# Patient Record
Sex: Female | Born: 1980
Health system: Southern US, Community
[De-identification: ages and names within clinical notes are randomized; demographics above are authoritative.]

## PROBLEM LIST (undated history)

## (undated) DIAGNOSIS — L719 Rosacea, unspecified: Secondary | ICD-10-CM

## (undated) DIAGNOSIS — M069 Rheumatoid arthritis, unspecified: Secondary | ICD-10-CM

## (undated) HISTORY — DX: Rheumatoid arthritis, unspecified: M06.9

## (undated) HISTORY — DX: Rosacea, unspecified: L71.9

---
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
## 2004-01-30 ENCOUNTER — Other Ambulatory Visit: Admission: RE | Admit: 2004-01-30 | Discharge: 2004-01-30 | Payer: Self-pay | Admitting: Family Medicine

## 2004-12-16 HISTORY — PX: OTHER SURGICAL HISTORY: SHX169

## 2005-01-23 ENCOUNTER — Other Ambulatory Visit: Admission: RE | Admit: 2005-01-23 | Discharge: 2005-01-23 | Payer: Self-pay | Admitting: Family Medicine

## 2005-07-24 ENCOUNTER — Other Ambulatory Visit: Admission: RE | Admit: 2005-07-24 | Discharge: 2005-07-24 | Payer: Self-pay | Admitting: Family Medicine

## 2006-02-11 ENCOUNTER — Other Ambulatory Visit: Admission: RE | Admit: 2006-02-11 | Discharge: 2006-02-11 | Payer: Self-pay | Admitting: Family Medicine

## 2007-12-15 ENCOUNTER — Encounter: Admission: RE | Admit: 2007-12-15 | Discharge: 2008-02-02 | Payer: Self-pay | Admitting: Family Medicine

## 2008-04-14 ENCOUNTER — Encounter: Admission: RE | Admit: 2008-04-14 | Discharge: 2008-04-14 | Payer: Self-pay | Admitting: Rheumatology

## 2008-11-25 ENCOUNTER — Ambulatory Visit (HOSPITAL_COMMUNITY): Admission: RE | Admit: 2008-11-25 | Discharge: 2008-11-25 | Payer: Self-pay | Admitting: Obstetrics and Gynecology

## 2008-11-29 ENCOUNTER — Ambulatory Visit (HOSPITAL_COMMUNITY): Admission: RE | Admit: 2008-11-29 | Discharge: 2008-11-29 | Payer: Self-pay | Admitting: Obstetrics and Gynecology

## 2009-10-31 ENCOUNTER — Ambulatory Visit (HOSPITAL_COMMUNITY): Admission: RE | Admit: 2009-10-31 | Discharge: 2009-10-31 | Payer: Self-pay | Admitting: Obstetrics and Gynecology

## 2009-11-21 ENCOUNTER — Ambulatory Visit (HOSPITAL_COMMUNITY): Admission: RE | Admit: 2009-11-21 | Discharge: 2009-11-21 | Payer: Self-pay | Admitting: Obstetrics and Gynecology

## 2010-03-28 ENCOUNTER — Inpatient Hospital Stay (HOSPITAL_COMMUNITY): Admission: AD | Admit: 2010-03-28 | Discharge: 2010-03-30 | Payer: Self-pay | Admitting: Obstetrics and Gynecology

## 2011-01-02 ENCOUNTER — Encounter
Admission: RE | Admit: 2011-01-02 | Discharge: 2011-01-02 | Payer: Self-pay | Source: Home / Self Care | Attending: General Surgery | Admitting: General Surgery

## 2011-01-06 ENCOUNTER — Encounter: Payer: Self-pay | Admitting: Obstetrics and Gynecology

## 2011-03-06 LAB — CBC
HCT: 30.1 % — ABNORMAL LOW (ref 36.0–46.0)
HCT: 35.9 % — ABNORMAL LOW (ref 36.0–46.0)
MCHC: 33.3 g/dL (ref 30.0–36.0)
MCV: 82.9 fL (ref 78.0–100.0)
Platelets: 205 10*3/uL (ref 150–400)
Platelets: 267 10*3/uL (ref 150–400)
RDW: 14.2 % (ref 11.5–15.5)
WBC: 11.9 10*3/uL — ABNORMAL HIGH (ref 4.0–10.5)

## 2011-03-06 LAB — RPR: RPR Ser Ql: NONREACTIVE

## 2016-03-20 DIAGNOSIS — M064 Inflammatory polyarthropathy: Secondary | ICD-10-CM | POA: Diagnosis not present

## 2016-03-20 DIAGNOSIS — M79641 Pain in right hand: Secondary | ICD-10-CM | POA: Diagnosis not present

## 2016-03-20 DIAGNOSIS — M2550 Pain in unspecified joint: Secondary | ICD-10-CM | POA: Diagnosis not present

## 2016-03-20 DIAGNOSIS — M25561 Pain in right knee: Secondary | ICD-10-CM | POA: Diagnosis not present

## 2016-03-20 DIAGNOSIS — M79671 Pain in right foot: Secondary | ICD-10-CM | POA: Diagnosis not present

## 2016-06-16 DIAGNOSIS — J019 Acute sinusitis, unspecified: Secondary | ICD-10-CM | POA: Diagnosis not present

## 2016-06-16 DIAGNOSIS — H6693 Otitis media, unspecified, bilateral: Secondary | ICD-10-CM | POA: Diagnosis not present

## 2016-06-20 DIAGNOSIS — J329 Chronic sinusitis, unspecified: Secondary | ICD-10-CM | POA: Diagnosis not present

## 2016-06-20 DIAGNOSIS — H6690 Otitis media, unspecified, unspecified ear: Secondary | ICD-10-CM | POA: Diagnosis not present

## 2016-06-20 DIAGNOSIS — J209 Acute bronchitis, unspecified: Secondary | ICD-10-CM | POA: Diagnosis not present

## 2016-09-19 ENCOUNTER — Ambulatory Visit (INDEPENDENT_AMBULATORY_CARE_PROVIDER_SITE_OTHER): Payer: Federal, State, Local not specified - PPO | Admitting: Rheumatology

## 2016-09-19 DIAGNOSIS — M79641 Pain in right hand: Secondary | ICD-10-CM | POA: Diagnosis not present

## 2016-09-19 DIAGNOSIS — M25562 Pain in left knee: Secondary | ICD-10-CM | POA: Diagnosis not present

## 2016-09-19 DIAGNOSIS — M25461 Effusion, right knee: Secondary | ICD-10-CM | POA: Diagnosis not present

## 2017-03-21 DIAGNOSIS — Z0000 Encounter for general adult medical examination without abnormal findings: Secondary | ICD-10-CM | POA: Diagnosis not present

## 2017-03-21 DIAGNOSIS — Z23 Encounter for immunization: Secondary | ICD-10-CM | POA: Diagnosis not present

## 2017-03-21 DIAGNOSIS — E785 Hyperlipidemia, unspecified: Secondary | ICD-10-CM | POA: Diagnosis not present

## 2017-09-08 DIAGNOSIS — Z6823 Body mass index (BMI) 23.0-23.9, adult: Secondary | ICD-10-CM | POA: Diagnosis not present

## 2017-09-08 DIAGNOSIS — Z01419 Encounter for gynecological examination (general) (routine) without abnormal findings: Secondary | ICD-10-CM | POA: Diagnosis not present

## 2017-12-25 DIAGNOSIS — R399 Unspecified symptoms and signs involving the genitourinary system: Secondary | ICD-10-CM | POA: Diagnosis not present

## 2018-01-29 DIAGNOSIS — Z30430 Encounter for insertion of intrauterine contraceptive device: Secondary | ICD-10-CM | POA: Diagnosis not present

## 2018-02-19 DIAGNOSIS — J0100 Acute maxillary sinusitis, unspecified: Secondary | ICD-10-CM | POA: Diagnosis not present

## 2018-02-25 DIAGNOSIS — N939 Abnormal uterine and vaginal bleeding, unspecified: Secondary | ICD-10-CM | POA: Diagnosis not present

## 2018-03-24 DIAGNOSIS — Z0000 Encounter for general adult medical examination without abnormal findings: Secondary | ICD-10-CM | POA: Diagnosis not present

## 2018-03-24 DIAGNOSIS — J309 Allergic rhinitis, unspecified: Secondary | ICD-10-CM | POA: Diagnosis not present

## 2018-03-24 DIAGNOSIS — M069 Rheumatoid arthritis, unspecified: Secondary | ICD-10-CM | POA: Diagnosis not present

## 2018-03-24 DIAGNOSIS — E785 Hyperlipidemia, unspecified: Secondary | ICD-10-CM | POA: Diagnosis not present

## 2018-04-01 DIAGNOSIS — F411 Generalized anxiety disorder: Secondary | ICD-10-CM | POA: Diagnosis not present

## 2018-04-06 NOTE — Progress Notes (Signed)
Office Visit Note  Patient: Leah Ball             Date of Birth: 1981/10/06           MRN: [id number]             PCP: Jarrett Soho, PA-C Referring: Marcelo Baldy, PA-C Visit Date: 04/20/2018 Occupation: @GUAROCC @    Subjective:  Hand pain    History of Present Illness: Leah Ball is a 37 y.o. female with history of inflammatory arthritis. Patient was last seen in October 2017.  She reports that she has been off of medications due to considering having a second child.  She is no longer planning pregnancy and has a Mirena IUD. She was previously on Sulfasalazine and tolerated it well. She also was intermittently put on Prednisone tapers. She reports that she continues to have left knee pain, stiffness, and occasional swelling.  She denies any recent injuries.  She reports she occasionally experiences a locking or catching sensation in her left knee.  She denies any pain in right knee.  She states that CBD oil and Advil have been helping with pain relief and inflammation.  In December she started having pain in her right hand with intermittent swelling. She also has stiffness in bilateral hands.  She denies any injuries or overuse activities.  She is right hand dominant. She has neck stiffness and muscle tension.  She denies any other joint pain or swelling.      Activities of Daily Living:  Patient reports morning stiffness for 30  minutes.   Patient Denies nocturnal pain.  Difficulty dressing/grooming: Denies Difficulty climbing stairs: Reports Difficulty getting out of chair: Reports Difficulty using hands for taps, buttons, cutlery, and/or writing: Reports   Review of Systems  Constitutional: Negative for fatigue.  HENT: Positive for mouth dryness. Negative for mouth sores and nose dryness.   Eyes: Positive for redness and dryness. Negative for pain and visual disturbance.  Respiratory: Negative for cough, hemoptysis, shortness of breath and difficulty breathing.    Cardiovascular: Negative for chest pain, palpitations, hypertension and swelling in legs/feet.  Gastrointestinal: Negative for blood in stool, constipation and diarrhea.  Endocrine: Negative for increased urination.  Genitourinary: Negative for painful urination.  Musculoskeletal: Positive for arthralgias, joint pain, joint swelling and morning stiffness. Negative for myalgias, muscle weakness, muscle tenderness and myalgias.  Skin: Positive for redness and sensitivity to sunlight. Negative for color change, pallor, rash, hair loss, nodules/bumps, skin tightness and ulcers.  Allergic/Immunologic: Negative for susceptible to infections.  Neurological: Negative for dizziness, numbness, headaches and weakness.  Hematological: Negative for swollen glands.  Psychiatric/Behavioral: Negative for depressed mood and sleep disturbance. The patient is nervous/anxious.     PMFS History:  There are no active problems to display for this patient.   History reviewed. No pertinent past medical history.  Family History  Problem Relation Age of Onset  . Heart attack Father   . High Cholesterol Father   . High Cholesterol Sister   . Healthy Son    Past Surgical History:  Procedure Laterality Date  . dental implant  2006  . WISDOM TOOTH EXTRACTION  1999   Social History   Social History Narrative  . Not on file     Objective: Vital Signs: BP 93/68 (BP Location: Left Arm, Patient Position: Sitting, Cuff Size: Normal)   Pulse 90   Resp 14   Ht 5\' 6"  (1.676 m)   Wt 134 lb (60.8 kg)   BMI  21.63 kg/m    Physical Exam  Constitutional: She is oriented to person, place, and time. She appears well-developed and well-nourished.  HENT:  Head: Normocephalic and atraumatic.  Eyes: Conjunctivae and EOM are normal.  Neck: Normal range of motion.  Cardiovascular: Normal rate, regular rhythm, normal heart sounds and intact distal pulses.  Pulmonary/Chest: Effort normal and breath sounds normal.    Abdominal: Soft. Bowel sounds are normal.  Lymphadenopathy:    She has no cervical adenopathy.  Neurological: She is alert and oriented to person, place, and time.  Skin: Skin is warm and dry. Capillary refill takes less than 2 seconds.  Psychiatric: She has a normal mood and affect. Her behavior is normal.  Nursing note and vitals reviewed.    Musculoskeletal Exam: C-spine, thoracic spine, and lumbar spine good ROM.  No midline spinal tenderness or SI joint tenderness.  Shoulder joints, elbow joints, wrist joints, MCPs, PIPs, and DIPs good ROM.  She has a mild flexion contracture in right 2nd PIP.  She has tenderness of right 2nd and 3rd PIP joints.  No synovitis was noted.  Hip joints, knee joints, ankle joints, MTPs, PIPs, and DIPs good ROM with no synovitis.  She has crepitus of left knee.  No warmth or effusion noted.  No tenderness of trochanteric bursa bilaterally.    CDAI Exam: No CDAI exam completed.    Investigation: No additional findings. CBC Latest Ref Rng & Units 03/29/2010 03/28/2010  WBC 4.0 - 10.5 K/uL 11.6(H) 11.9(H)  Hemoglobin 12.0 - 15.0 g/dL 10.1(L) 11.7(L)  Hematocrit 36.0 - 46.0 % 30.1(L) 35.9(L)  Platelets 150 - 400 K/uL 205 DELTA CHECK NOTED 267    Imaging: No results found.  Speciality Comments: No specialty comments available.    Procedures:  No procedures performed Allergies: Patient has no known allergies.   Assessment / Plan:     Visit Diagnoses: Inflammatory arthritis -She has no active synovitis noted.  She has tenderness of right 2nd and 3rd PIP joints.  She has been having increased pain and stiffness in her right hand since December 2018. She continues to have chronic pain in her left knee.  No warmth or effusion on exam today. She was previously on Sulfasalazine but discontinued due to planning pregnancy.  She currently has the Mirena IUD and is not planning pregnancy. She would like to restart on Sulfasalazine due to tolerating the medication  in the past.  X-rays of bilateral hands were obtained today.  We will schedule an ultrasound of bilateral hands to if there is active synovitis.  Consent for sulfasalazine was obtained today in case she does decide to start on it in the future.  CBC, CMP, and TB gold were also ordered today.   Medication counseling:  G6PD: WNL 02/25/2016.  Patient was counseled on the purpose, proper use, and adverse effects of sulfasalazine including risk of infection and chance of nausea, headache, and sun sensitivity.  Also discussed risk of skin rash and advised patient to stop the medication and let us know if she develops a rash. Also discussed for the potential of discoloration of the urine, sweat, or tears.  Reviewed the importance of frequent labs to monitor liver, kidneys, and blood counts; and provided patient with standing lab instructions.  Provided patient with educational materials on sulfasalazine and answered all questions.    Pain in both hands - X-rays of bilateral hands were obtained today. Plan: XR Hand 2 View Left, XR Hand 2 View Right  H/O immunosuppressive therapy -  CBC, CMP, and TB gold were ordered today. Plan: CBC with Differential/Platelet, COMPLETE METABOLIC PANEL WITH GFR, QuantiFERON-TB Gold Plus    Orders: Orders Placed This Encounter  Procedures  . XR Hand 2 View Left  . XR Hand 2 View Right  . CBC with Differential/Platelet  . COMPLETE METABOLIC PANEL WITH GFR  . QuantiFERON-TB Gold Plus   No orders of the defined types were placed in this encounter.   Face-to-face time spent with patient was 30 minutes. >50% of time was spent in counseling and coordination of care.  Follow-Up Instructions: Return in about 3 months (around 07/21/2018) for Inflammatory arthritis .   Gearldine Bienenstockaylor M Eddie Koc, PA-C  Note - This record has been created using Dragon software.  Chart creation errors have been sought, but may not always  have been located. Such creation errors do not reflect on  the  standard of medical care.

## 2018-04-07 DIAGNOSIS — F411 Generalized anxiety disorder: Secondary | ICD-10-CM | POA: Diagnosis not present

## 2018-04-14 DIAGNOSIS — F411 Generalized anxiety disorder: Secondary | ICD-10-CM | POA: Diagnosis not present

## 2018-04-20 ENCOUNTER — Ambulatory Visit (INDEPENDENT_AMBULATORY_CARE_PROVIDER_SITE_OTHER): Payer: Federal, State, Local not specified - PPO

## 2018-04-20 ENCOUNTER — Ambulatory Visit (INDEPENDENT_AMBULATORY_CARE_PROVIDER_SITE_OTHER): Payer: Self-pay

## 2018-04-20 ENCOUNTER — Encounter: Payer: Self-pay | Admitting: Physician Assistant

## 2018-04-20 ENCOUNTER — Ambulatory Visit: Payer: Federal, State, Local not specified - PPO | Admitting: Physician Assistant

## 2018-04-20 VITALS — BP 93/68 | HR 90 | Resp 14 | Ht 66.0 in | Wt 134.0 lb

## 2018-04-20 DIAGNOSIS — M79641 Pain in right hand: Secondary | ICD-10-CM

## 2018-04-20 DIAGNOSIS — M79642 Pain in left hand: Secondary | ICD-10-CM

## 2018-04-20 DIAGNOSIS — Z9225 Personal history of immunosupression therapy: Secondary | ICD-10-CM

## 2018-04-20 DIAGNOSIS — M1990 Unspecified osteoarthritis, unspecified site: Secondary | ICD-10-CM | POA: Diagnosis not present

## 2018-04-20 NOTE — Patient Instructions (Addendum)
Knee Exercises Ask your health care provider which exercises are safe for you. Do exercises exactly as told by your health care provider and adjust them as directed. It is normal to feel mild stretching, pulling, tightness, or discomfort as you do these exercises, but you should stop right away if you feel sudden pain or your pain gets worse.Do not begin these exercises until told by your health care provider. STRETCHING AND RANGE OF MOTION EXERCISES These exercises warm up your muscles and joints and improve the movement and flexibility of your knee. These exercises also help to relieve pain, numbness, and tingling. Exercise A: Knee Extension, Prone 1. Lie on your abdomen on a bed. 2. Place your left / right knee just beyond the edge of the surface so your knee is not on the bed. You can put a towel under your left / right thigh just above your knee for comfort. 3. Relax your leg muscles and allow gravity to straighten your knee. You should feel a stretch behind your left / right knee. 4. Hold this position for __________ seconds. 5. Scoot up so your knee is supported between repetitions. Repeat __________ times. Complete this stretch __________ times a day. Exercise B: Knee Flexion, Active  1. Lie on your back with both knees straight. If this causes back discomfort, bend your left / right knee so your foot is flat on the floor. 2. Slowly slide your left / right heel back toward your buttocks until you feel a gentle stretch in the front of your knee or thigh. 3. Hold this position for __________ seconds. 4. Slowly slide your left / right heel back to the starting position. Repeat __________ times. Complete this exercise __________ times a day. Exercise C: Quadriceps, Prone  1. Lie on your abdomen on a firm surface, such as a bed or padded floor. 2. Bend your left / right knee and hold your ankle. If you cannot reach your ankle or pant leg, loop a belt around your foot and grab the belt  instead. 3. Gently pull your heel toward your buttocks. Your knee should not slide out to the side. You should feel a stretch in the front of your thigh and knee. 4. Hold this position for __________ seconds. Repeat __________ times. Complete this stretch __________ times a day. Exercise D: Hamstring, Supine 1. Lie on your back. 2. Loop a belt or towel over the ball of your left / right foot. The ball of your foot is on the walking surface, right under your toes. 3. Straighten your left / right knee and slowly pull on the belt to raise your leg until you feel a gentle stretch behind your knee. ? Do not let your left / right knee bend while you do this. ? Keep your other leg flat on the floor. 4. Hold this position for __________ seconds. Repeat __________ times. Complete this stretch __________ times a day. STRENGTHENING EXERCISES These exercises build strength and endurance in your knee. Endurance is the ability to use your muscles for a long time, even after they get tired. Exercise E: Quadriceps, Isometric  1. Lie on your back with your left / right leg extended and your other knee bent. Put a rolled towel or small pillow under your knee if told by your health care provider. 2. Slowly tense the muscles in the front of your left / right thigh. You should see your kneecap slide up toward your hip or see increased dimpling just above the knee. This   motion will push the back of the knee toward the floor. 3. For __________ seconds, keep the muscle as tight as you can without increasing your pain. 4. Relax the muscles slowly and completely. Repeat __________ times. Complete this exercise __________ times a day. Exercise F: Straight Leg Raises - Quadriceps 1. Lie on your back with your left / right leg extended and your other knee bent. 2. Tense the muscles in the front of your left / right thigh. You should see your kneecap slide up or see increased dimpling just above the knee. Your thigh may  even shake a bit. 3. Keep these muscles tight as you raise your leg 4-6 inches (10-15 cm) off the floor. Do not let your knee bend. 4. Hold this position for __________ seconds. 5. Keep these muscles tense as you lower your leg. 6. Relax your muscles slowly and completely after each repetition. Repeat __________ times. Complete this exercise __________ times a day. Exercise G: Hamstring, Isometric 1. Lie on your back on a firm surface. 2. Bend your left / right knee approximately __________ degrees. 3. Dig your left / right heel into the surface as if you are trying to pull it toward your buttocks. Tighten the muscles in the back of your thighs to dig as hard as you can without increasing any pain. 4. Hold this position for __________ seconds. 5. Release the tension gradually and allow your muscles to relax completely for __________ seconds after each repetition. Repeat __________ times. Complete this exercise __________ times a day. Exercise H: Hamstring Curls  If told by your health care provider, do this exercise while wearing ankle weights. Begin with __________ weights. Then increase the weight by 1 lb (0.5 kg) increments. Do not wear ankle weights that are more than __________. 1. Lie on your abdomen with your legs straight. 2. Bend your left / right knee as far as you can without feeling pain. Keep your hips flat against the floor. 3. Hold this position for __________ seconds. 4. Slowly lower your leg to the starting position.  Repeat __________ times. Complete this exercise __________ times a day. Exercise I: Squats (Quadriceps) 1. Stand in front of a table, with your feet and knees pointing straight ahead. You may rest your hands on the table for balance but not for support. 2. Slowly bend your knees and lower your hips like you are going to sit in a chair. ? Keep your weight over your heels, not over your toes. ? Keep your lower legs upright so they are parallel with the table  legs. ? Do not let your hips go lower than your knees. ? Do not bend lower than told by your health care provider. ? If your knee pain increases, do not bend as low. 3. Hold the squat position for __________ seconds. 4. Slowly push with your legs to return to standing. Do not use your hands to pull yourself to standing. Repeat __________ times. Complete this exercise __________ times a day. Exercise J: Wall Slides (Quadriceps)  1. Lean your back against a smooth wall or door while you walk your feet out 18-24 inches (46-61 cm) from it. 2. Place your feet hip-width apart. 3. Slowly slide down the wall or door until your knees bend __________ degrees. Keep your knees over your heels, not over your toes. Keep your knees in line with your hips. 4. Hold for __________ seconds. Repeat __________ times. Complete this exercise __________ times a day. Exercise K: Straight Leg Raises -   Hip Abductors 1. Lie on your side with your left / right leg in the top position. Lie so your head, shoulder, knee, and hip line up. You may bend your bottom knee to help you keep your balance. 2. Roll your hips slightly forward so your hips are stacked directly over each other and your left / right knee is facing forward. 3. Leading with your heel, lift your top leg 4-6 inches (10-15 cm). You should feel the muscles in your outer hip lifting. ? Do not let your foot drift forward. ? Do not let your knee roll toward the ceiling. 4. Hold this position for __________ seconds. 5. Slowly return your leg to the starting position. 6. Let your muscles relax completely after each repetition. Repeat __________ times. Complete this exercise __________ times a day. Exercise L: Straight Leg Raises - Hip Extensors 1. Lie on your abdomen on a firm surface. You can put a pillow under your hips if that is more comfortable. 2. Tense the muscles in your buttocks and lift your left / right leg about 4-6 inches (10-15 cm). Keep your knee  straight as you lift your leg. 3. Hold this position for __________ seconds. 4. Slowly lower your leg to the starting position. 5. Let your leg relax completely after each repetition. Repeat __________ times. Complete this exercise __________ times a day. This information is not intended to replace advice given to you by your health care provider. Make sure you discuss any questions you have with your health care provider. Document Released: 10/16/2005 Document Revised: 08/26/2016 Document Reviewed: 10/08/2015 Elsevier Interactive Patient Education  2018 [employer]   Hand Exercises Hand exercises can be helpful to almost anyone. These exercises can strengthen the hands, improve flexibility and movement, and increase blood flow to the hands. These results can make work and daily tasks easier. Hand exercises can be especially helpful for people who have joint pain from arthritis or have nerve damage from overuse (carpal tunnel syndrome). These exercises can also help people who have injured a hand. Most of these hand exercises are fairly gentle stretching routines. You can do them often throughout the day. Still, it is a good idea to ask your health care provider which exercises would be best for you. Warming your hands before exercise may help to reduce stiffness. You can do this with gentle massage or by placing your hands in warm water for 15 minutes. Also, make sure you pay attention to your level of hand pain as you begin an exercise routine. Exercises Knuckle Bend Repeat this exercise 5-10 times with each hand. 1. Stand or sit with your arm, hand, and all five fingers pointed straight up. Make sure your wrist is straight. 2. Gently and slowly bend your fingers down and inward until the tips of your fingers are touching the tops of your palm. 3. Hold this position for a few seconds. 4. Extend your fingers out to their original position, all pointing straight up again.  Finger Fan Repeat  this exercise 5-10 times with each hand. 1. Hold your arm and hand out in front of you. Keep your wrist straight. 2. Squeeze your hand into a fist. 3. Hold this position for a few seconds. 4. Loel Dubonnet out, or spread apart, your hand and fingers as much as possible, stretching every joint fully.  Tabletop Repeat this exercise 5-10 times with each hand. 1. Stand or sit with your arm, hand, and all five fingers pointed straight up. Make sure your wrist  is straight. 2. Gently and slowly bend your fingers at the knuckles where they meet the hand until your hand is making an upside-down L shape. Your fingers should form a tabletop. 3. Hold this position for a few seconds. 4. Extend your fingers out to their original position, all pointing straight up again.  Making Os Repeat this exercise 5-10 times with each hand. 1. Stand or sit with your arm, hand, and all five fingers pointed straight up. Make sure your wrist is straight. 2. Make an O shape by touching your pointer finger to your thumb. Hold for a few seconds. Then open your hand wide. 3. Repeat this motion with each finger on your hand.  Table Spread Repeat this exercise 5-10 times with each hand. 1. Place your hand on a table with your palm facing down. Make sure your wrist is straight. 2. Spread your fingers out as much as possible. Hold this position for a few seconds. 3. Slide your fingers back together again. Hold for a few seconds.  Ball Grip  Repeat this exercise 10-15 times with each hand. 1. Hold a tennis ball or another soft ball in your hand. 2. While slowly increasing pressure, squeeze the ball as hard as possible. 3. Squeeze as hard as you can for 3-5 seconds. 4. Relax and repeat.  Wrist Curls Repeat this exercise 10-15 times with each hand. 1. Sit in a chair that has armrests. 2. Hold a light weight in your hand, such as a dumbbell that weighs 1-3 pounds (0.5-1.4 kg). Ask your health care provider what weight would be best  for you. 3. Rest your hand just over the end of the chair arm with your palm facing up. 4. Gently pivot your wrist up and down while holding the weight. Do not twist your wrist from side to side.  Contact a health care provider if:  Your hand pain or discomfort gets much worse when you do an exercise.  Your hand pain or discomfort does not improve within 2 hours after you exercise. If you have any of these problems, stop doing these exercises right away. Do not do them again unless your health care provider says that you can. Get help right away if:  You develop sudden, severe hand pain. If this happens, stop doing these exercises right away. Do not do them again unless your health care provider says that you can. This information is not intended to replace advice given to you by your health care provider. Make sure you discuss any questions you have with your health care provider. Document Released: 11/13/2015 Document Revised: 05/09/2016 Document Reviewed: 06/12/2015 Elsevier Interactive Patient Education  2018 [employer].    Sulfasalazine tablets What is this medicine? SULFASALAZINE (sul fa SAL a zeen) is used to treat ulcerative colitis. This medicine may be used for other purposes; ask your health care provider or pharmacist if you have questions. COMMON BRAND NAME(S): Azulfidine, Sulfazine What should I tell my health care provider before I take this medicine? They need to know if you have any of these conditions: -asthma -blood disorders or anemia -glucose-6-phosphate dehydrogenase (G6PD) deficiency -intestinal obstruction -kidney disease -liver disease -porphyria -urinary tract obstruction -an unusual reaction to sulfasalazine, sulfa drugs, salicylates, or other medicines, foods, dyes, or preservatives -pregnant or trying to get pregnant -breast-feeding How should I use this medicine? Take this medicine by mouth with a full glass of water. Follow the directions on the  prescription label. If the medicine upsets your stomach, take  it with food or milk. Take your medicine at regular intervals. Do not take your medicine more often than directed. Do not stop taking except on your doctor's advice. Talk to your pediatrician regarding the use of this medicine in children. While this drug may be prescribed for children as young as 6 years for selected conditions, precautions do apply. Patients over 31 years old may have a stronger reaction and need a smaller dose. Overdosage: If you think you have taken too much of this medicine contact a poison control center or emergency room at once. NOTE: This medicine is only for you. Do not share this medicine with others. What if I miss a dose? If you miss a dose, take it as soon as you can. If it is almost time for your next dose, take only that dose. Do not take double or extra doses. What may interact with this medicine? -digoxin -folic acid This list may not describe all possible interactions. Give your health care provider a list of all the medicines, herbs, non-prescription drugs, or dietary supplements you use. Also tell them if you smoke, drink alcohol, or use illegal drugs. Some items may interact with your medicine. What should I watch for while using this medicine? Visit your doctor or health care professional for regular checks on your progress. You will need frequent blood and urine checks. This medicine can make you more sensitive to the sun. Keep out of the sun. If you cannot avoid being in the sun, wear protective clothing and use sunscreen. Do not use sun lamps or tanning beds/booths. Drink plenty of water while taking this medicine. What side effects may I notice from receiving this medicine? Side effects that you should report to your doctor or health care professional as soon as possible: -allergic reactions like skin rash, itching or hives, swelling of the face, lips, or tongue -fever, chills, or any other  sign of infection -painful, difficult, or reduced urination -redness, blistering, peeling or loosening of the skin, including inside the mouth -severe stomach pain -unusual bleeding or bruising -unusually weak or tired -yellowing of the skin or eyes Side effects that usually do not require medical attention (report to your doctor or health care professional if they continue or are bothersome): -headache -loss of appetite -nausea, vomiting -orange color to the urine -reduced sperm count This list may not describe all possible side effects. Call your doctor for medical advice about side effects. You may report side effects to FDA at 1-800-FDA-1088. Where should I keep my medicine? Keep out of the reach of children. Store at room temperature between 15 and 30 degrees C (59 and 86 degrees F). Keep container tightly closed. Throw away any unused medicine after the expiration date. NOTE: This sheet is a summary. It may not cover all possible information. If you have questions about this medicine, talk to your doctor, pharmacist, or health care provider.  2018 Elsevier/Gold Standard (2008-08-03 11:38:15)

## 2018-04-21 DIAGNOSIS — F411 Generalized anxiety disorder: Secondary | ICD-10-CM | POA: Diagnosis not present

## 2018-04-21 NOTE — Progress Notes (Signed)
Potassium borderline elevated.  Please make sure she is not taking a potassium supplement at this time.  Calcium is also borderline elevated.  All other lab values are WNL.

## 2018-04-22 LAB — CBC WITH DIFFERENTIAL/PLATELET
Basophils Absolute: 29 cells/uL (ref 0–200)
Basophils Relative: 0.7 %
EOS PCT: 1 %
Eosinophils Absolute: 41 cells/uL (ref 15–500)
HEMATOCRIT: 40.9 % (ref 35.0–45.0)
HEMOGLOBIN: 13.5 g/dL (ref 11.7–15.5)
Lymphs Abs: 1181 cells/uL (ref 850–3900)
MCH: 29.6 pg (ref 27.0–33.0)
MCHC: 33 g/dL (ref 32.0–36.0)
MCV: 89.7 fL (ref 80.0–100.0)
MONOS PCT: 5.1 %
MPV: 9.6 fL (ref 7.5–12.5)
NEUTROS ABS: 2640 {cells}/uL (ref 1500–7800)
NEUTROS PCT: 64.4 %
Platelets: 379 10*3/uL (ref 140–400)
RBC: 4.56 10*6/uL (ref 3.80–5.10)
RDW: 11.9 % (ref 11.0–15.0)
Total Lymphocyte: 28.8 %
WBC mixed population: 209 cells/uL (ref 200–950)
WBC: 4.1 10*3/uL (ref 3.8–10.8)

## 2018-04-22 LAB — COMPLETE METABOLIC PANEL WITH GFR
AG Ratio: 1.8 (calc) (ref 1.0–2.5)
ALBUMIN MSPROF: 4.9 g/dL (ref 3.6–5.1)
ALT: 9 U/L (ref 6–29)
AST: 15 U/L (ref 10–30)
Alkaline phosphatase (APISO): 75 U/L (ref 33–115)
BUN: 8 mg/dL (ref 7–25)
CALCIUM: 10.3 mg/dL — AB (ref 8.6–10.2)
CHLORIDE: 105 mmol/L (ref 98–110)
CO2: 29 mmol/L (ref 20–32)
Creat: 0.75 mg/dL (ref 0.50–1.10)
GFR, EST NON AFRICAN AMERICAN: 103 mL/min/{1.73_m2} (ref >=60)
GFR, Est African American: 119 mL/min/{1.73_m2} (ref >=60)
GLOBULIN: 2.7 g/dL (ref 1.9–3.7)
Glucose, Bld: 96 mg/dL (ref 65–99)
POTASSIUM: 5.4 mmol/L — AB (ref 3.5–5.3)
Sodium: 141 mmol/L (ref 135–146)
TOTAL PROTEIN: 7.6 g/dL (ref 6.1–8.1)
Total Bilirubin: 0.5 mg/dL (ref 0.2–1.2)

## 2018-04-22 LAB — QUANTIFERON-TB GOLD PLUS
Mitogen-NIL: 6.74 IU/mL
NIL: 0.03 [IU]/mL
QuantiFERON-TB Gold Plus: NEGATIVE
TB1-NIL: 0.01 IU/mL

## 2018-04-22 NOTE — Progress Notes (Signed)
TB gold negative

## 2018-04-29 DIAGNOSIS — F411 Generalized anxiety disorder: Secondary | ICD-10-CM | POA: Diagnosis not present

## 2018-05-06 ENCOUNTER — Ambulatory Visit (INDEPENDENT_AMBULATORY_CARE_PROVIDER_SITE_OTHER): Payer: Self-pay

## 2018-05-06 ENCOUNTER — Ambulatory Visit: Payer: Federal, State, Local not specified - PPO | Admitting: Rheumatology

## 2018-05-06 DIAGNOSIS — M79641 Pain in right hand: Secondary | ICD-10-CM

## 2018-05-06 DIAGNOSIS — M79642 Pain in left hand: Secondary | ICD-10-CM

## 2018-05-06 NOTE — Progress Notes (Signed)
Ultrasound examination of bilateral hands was performed per EULAR recommendations. Using 12 MHz transducer, grayscale and power Doppler bilateral second, third, and fifth MCP joints and bilateral wrist joints both dorsal and volar aspects were evaluated to look for synovitis or tenosynovitis. The findings were there was no synovitis or tenosynovitis on ultrasound examination. Right median nerve was 0.08 cm squares which was within normal limits and bifid and left median nerve was 0.05 cm squares which was within normal limits.  Impression: Ultrasound examination did not show any synovitis.  Bilateral median nerves are within normal limits. Pollyann Savoy, MD

## 2018-05-14 DIAGNOSIS — F411 Generalized anxiety disorder: Secondary | ICD-10-CM | POA: Diagnosis not present

## 2018-05-26 DIAGNOSIS — F411 Generalized anxiety disorder: Secondary | ICD-10-CM | POA: Diagnosis not present

## 2018-06-09 DIAGNOSIS — F411 Generalized anxiety disorder: Secondary | ICD-10-CM | POA: Diagnosis not present

## 2018-06-30 DIAGNOSIS — F411 Generalized anxiety disorder: Secondary | ICD-10-CM | POA: Diagnosis not present

## 2018-07-14 DIAGNOSIS — F411 Generalized anxiety disorder: Secondary | ICD-10-CM | POA: Diagnosis not present

## 2018-07-16 NOTE — Progress Notes (Deleted)
   Office Visit Note  Patient: Leah Ball             Date of Birth: 07/20/1981           MRN: [id number]             PCP: Jarrett SohoWharton, Courtney, PA-C Referring: Jarrett SohoWharton, Courtney, PA-C Visit Date: 07/30/2018 Occupation: @GUAROCC @  Subjective:  No chief complaint on file.   History of Present Illness: Leah Ball is a 37 y.o. female ***   Activities of Daily Living:  Patient reports morning stiffness for *** {minute/hour:19697}.   Patient {ACTIONS;DENIES/REPORTS:21021675::"Denies"} nocturnal pain.  Difficulty dressing/grooming: {ACTIONS;DENIES/REPORTS:21021675::"Denies"} Difficulty climbing stairs: {ACTIONS;DENIES/REPORTS:21021675::"Denies"} Difficulty getting out of chair: {ACTIONS;DENIES/REPORTS:21021675::"Denies"} Difficulty using hands for taps, buttons, cutlery, and/or writing: {ACTIONS;DENIES/REPORTS:21021675::"Denies"}  No Rheumatology ROS completed.   PMFS History:  There are no active problems to display for this patient.   No past medical history on file.  Family History  Problem Relation Age of Onset  . Heart attack Father   . High Cholesterol Father   . High Cholesterol Sister   . Healthy Son    Past Surgical History:  Procedure Laterality Date  . dental implant  2006  . WISDOM TOOTH EXTRACTION  1999   Social History   Social History Narrative  . Not on file    Objective: Vital Signs: There were no vitals taken for this visit.   Physical Exam   Musculoskeletal Exam: ***  CDAI Exam: No CDAI exam completed.   Investigation: No additional findings.  Imaging: No results found.  Recent Labs: Lab Results  Component Value Date   WBC 4.1 04/20/2018   HGB 13.5 04/20/2018   PLT 379 04/20/2018   NA 141 04/20/2018   K 5.4 (H) 04/20/2018   CL 105 04/20/2018   CO2 29 04/20/2018   GLUCOSE 96 04/20/2018   BUN 8 04/20/2018   CREATININE 0.75 04/20/2018   BILITOT 0.5 04/20/2018   AST 15 04/20/2018   ALT 9 04/20/2018   PROT 7.6 04/20/2018   CALCIUM 10.3 (H) 04/20/2018   GFRAA 119 04/20/2018   QFTBGOLDPLUS NEGATIVE 04/20/2018    Speciality Comments: No specialty comments available.  Procedures:  No procedures performed Allergies: Patient has no known allergies.   Assessment / Plan:     Visit Diagnoses: Inflammatory arthritis - 05/06/18: U/s did not reveal synovitis  H/O immunosuppressive therapy - Previously on SSZ   Orders: No orders of the defined types were placed in this encounter.  No orders of the defined types were placed in this encounter.   Face-to-face time spent with patient was *** minutes. Greater than 50% of time was spent in counseling and coordination of care.  Follow-Up Instructions: No follow-ups on file.   Gearldine Bienenstockaylor M Sergio Zawislak, PA-C  Note - This record has been created using Dragon software.  Chart creation errors have been sought, but may not always  have been located. Such creation errors do not reflect on  the standard of medical care.

## 2018-07-30 ENCOUNTER — Ambulatory Visit: Payer: Federal, State, Local not specified - PPO | Admitting: Physician Assistant

## 2018-09-04 DIAGNOSIS — L089 Local infection of the skin and subcutaneous tissue, unspecified: Secondary | ICD-10-CM | POA: Diagnosis not present

## 2018-10-30 DIAGNOSIS — R05 Cough: Secondary | ICD-10-CM | POA: Diagnosis not present

## 2018-10-30 DIAGNOSIS — J0100 Acute maxillary sinusitis, unspecified: Secondary | ICD-10-CM | POA: Diagnosis not present

## 2018-10-30 DIAGNOSIS — H6503 Acute serous otitis media, bilateral: Secondary | ICD-10-CM | POA: Diagnosis not present

## 2018-11-16 NOTE — Progress Notes (Deleted)
   Office Visit Note  Patient: Leah Ball             Date of Birth: 08/30/1981           MRN: [id number]             PCP: Jarrett SohoWharton, Courtney, PA-C Referring: Jarrett SohoWharton, Courtney, PA-C Visit Date: 11/26/2018 Occupation: @GUAROCC @  Subjective:  No chief complaint on file.   History of Present Illness: Leah Lislizabeth Clune is a 37 y.o. female ***   Activities of Daily Living:  Patient reports morning stiffness for *** {minute/hour:19697}.   Patient {ACTIONS;DENIES/REPORTS:21021675::"Denies"} nocturnal pain.  Difficulty dressing/grooming: {ACTIONS;DENIES/REPORTS:21021675::"Denies"} Difficulty climbing stairs: {ACTIONS;DENIES/REPORTS:21021675::"Denies"} Difficulty getting out of chair: {ACTIONS;DENIES/REPORTS:21021675::"Denies"} Difficulty using hands for taps, buttons, cutlery, and/or writing: {ACTIONS;DENIES/REPORTS:21021675::"Denies"}  No Rheumatology ROS completed.   PMFS History:  There are no active problems to display for this patient.   No past medical history on file.  Family History  Problem Relation Age of Onset  . Heart attack Father   . High Cholesterol Father   . High Cholesterol Sister   . Healthy Son    Past Surgical History:  Procedure Laterality Date  . dental implant  2006  . WISDOM TOOTH EXTRACTION  1999   Social History   Social History Narrative  . Not on file    Objective: Vital Signs: There were no vitals taken for this visit.   Physical Exam   Musculoskeletal Exam: ***  CDAI Exam: CDAI Score: Not documented Patient Global Assessment: Not documented; Provider Global Assessment: Not documented Swollen: Not documented; Tender: Not documented Joint Exam   Not documented   There is currently no information documented on the homunculus. Go to the Rheumatology activity and complete the homunculus joint exam.  Investigation: No additional findings.  Imaging: No results found.  Recent Labs: Lab Results  Component Value Date   WBC 4.1  04/20/2018   HGB 13.5 04/20/2018   PLT 379 04/20/2018   NA 141 04/20/2018   K 5.4 (H) 04/20/2018   CL 105 04/20/2018   CO2 29 04/20/2018   GLUCOSE 96 04/20/2018   BUN 8 04/20/2018   CREATININE 0.75 04/20/2018   BILITOT 0.5 04/20/2018   AST 15 04/20/2018   ALT 9 04/20/2018   PROT 7.6 04/20/2018   CALCIUM 10.3 (H) 04/20/2018   GFRAA 119 04/20/2018   QFTBGOLDPLUS NEGATIVE 04/20/2018    Speciality Comments: No specialty comments available.  Procedures:  No procedures performed Allergies: Patient has no known allergies.   Assessment / Plan:     Visit Diagnoses: Inflammatory arthritis  High risk medication use  H/O immunosuppressive therapy - SSZ   Orders: No orders of the defined types were placed in this encounter.  No orders of the defined types were placed in this encounter.   Face-to-face time spent with patient was *** minutes. Greater than 50% of time was spent in counseling and coordination of care.  Follow-Up Instructions: No follow-ups on file.   Gearldine Bienenstockaylor M Keaisha Sublette, PA-C  Note - This record has been created using Dragon software.  Chart creation errors have been sought, but may not always  have been located. Such creation errors do not reflect on  the standard of medical care.

## 2018-11-26 ENCOUNTER — Ambulatory Visit: Payer: Federal, State, Local not specified - PPO | Admitting: Physician Assistant

## 2019-02-26 DIAGNOSIS — H6091 Unspecified otitis externa, right ear: Secondary | ICD-10-CM | POA: Diagnosis not present

## 2019-06-29 DIAGNOSIS — B9689 Other specified bacterial agents as the cause of diseases classified elsewhere: Secondary | ICD-10-CM | POA: Diagnosis not present

## 2019-06-29 DIAGNOSIS — J019 Acute sinusitis, unspecified: Secondary | ICD-10-CM | POA: Diagnosis not present

## 2019-07-23 DIAGNOSIS — Z7189 Other specified counseling: Secondary | ICD-10-CM | POA: Diagnosis not present

## 2019-07-23 DIAGNOSIS — Z20828 Contact with and (suspected) exposure to other viral communicable diseases: Secondary | ICD-10-CM | POA: Diagnosis not present

## 2019-10-07 DIAGNOSIS — Z20828 Contact with and (suspected) exposure to other viral communicable diseases: Secondary | ICD-10-CM | POA: Diagnosis not present

## 2019-11-21 DIAGNOSIS — R21 Rash and other nonspecific skin eruption: Secondary | ICD-10-CM | POA: Diagnosis not present

## 2019-12-03 DIAGNOSIS — L0109 Other impetigo: Secondary | ICD-10-CM | POA: Diagnosis not present

## 2019-12-03 DIAGNOSIS — L239 Allergic contact dermatitis, unspecified cause: Secondary | ICD-10-CM | POA: Diagnosis not present

## 2019-12-31 DIAGNOSIS — Z20828 Contact with and (suspected) exposure to other viral communicable diseases: Secondary | ICD-10-CM | POA: Diagnosis not present

## 2019-12-31 DIAGNOSIS — Z20822 Contact with and (suspected) exposure to covid-19: Secondary | ICD-10-CM | POA: Diagnosis not present

## 2020-01-14 DIAGNOSIS — Z20822 Contact with and (suspected) exposure to covid-19: Secondary | ICD-10-CM | POA: Diagnosis not present

## 2020-01-14 DIAGNOSIS — Z20828 Contact with and (suspected) exposure to other viral communicable diseases: Secondary | ICD-10-CM | POA: Diagnosis not present

## 2020-01-21 DIAGNOSIS — Z20828 Contact with and (suspected) exposure to other viral communicable diseases: Secondary | ICD-10-CM | POA: Diagnosis not present

## 2020-01-21 DIAGNOSIS — Z20822 Contact with and (suspected) exposure to covid-19: Secondary | ICD-10-CM | POA: Diagnosis not present

## 2020-01-26 DIAGNOSIS — Z7189 Other specified counseling: Secondary | ICD-10-CM | POA: Diagnosis not present

## 2020-01-26 DIAGNOSIS — Z20828 Contact with and (suspected) exposure to other viral communicable diseases: Secondary | ICD-10-CM | POA: Diagnosis not present

## 2020-02-18 ENCOUNTER — Ambulatory Visit: Payer: Self-pay | Attending: Internal Medicine

## 2020-02-18 DIAGNOSIS — Z23 Encounter for immunization: Secondary | ICD-10-CM

## 2020-02-18 NOTE — Progress Notes (Signed)
   Covid-19 Vaccination Clinic  Name:  Leah Ball    MRN: [id number] DOB: 1981/11/20  02/18/2020  Leah Ball was observed post Covid-19 immunization for 15 minutes without incident. She was provided with Vaccine Information Sheet and instruction to access the V-Safe system.   Leah Ball was instructed to call 911 with any severe reactions post vaccine: Marland Kitchen Difficulty breathing  . Swelling of face and throat  . A fast heartbeat  . A bad rash all over body  . Dizziness and weakness

## 2020-03-21 ENCOUNTER — Ambulatory Visit: Payer: Self-pay | Attending: Internal Medicine

## 2020-03-21 DIAGNOSIS — Z23 Encounter for immunization: Secondary | ICD-10-CM

## 2020-03-21 DIAGNOSIS — Z03818 Encounter for observation for suspected exposure to other biological agents ruled out: Secondary | ICD-10-CM | POA: Diagnosis not present

## 2020-03-21 NOTE — Progress Notes (Signed)
   Covid-19 Vaccination Clinic  Name:  Leah Ball    MRN: [id number] DOB: 1980/12/26  03/21/2020  [name] was observed post Covid-19 immunization for 15 minutes without incident. She was provided with Vaccine Information Sheet and instruction to access the V-Safe system.   [name] was instructed to call 911 with any severe reactions post vaccine: Marland Kitchen Difficulty breathing  . Swelling of face and throat  . A fast heartbeat  . A bad rash all over body  . Dizziness and weakness   Immunizations Administered    Name Date Dose VIS Date Route   Pfizer COVID-19 Vaccine 03/21/2020  8:16 AM 0.3 mL 11/26/2019 Intramuscular   Manufacturer: [employer], [employer]   Lot: [id number]   NDC: [address]-8

## 2020-04-04 DIAGNOSIS — Z03818 Encounter for observation for suspected exposure to other biological agents ruled out: Secondary | ICD-10-CM | POA: Diagnosis not present

## 2020-05-10 DIAGNOSIS — L0109 Other impetigo: Secondary | ICD-10-CM | POA: Diagnosis not present

## 2020-05-10 DIAGNOSIS — L718 Other rosacea: Secondary | ICD-10-CM | POA: Diagnosis not present

## 2020-05-10 DIAGNOSIS — L2089 Other atopic dermatitis: Secondary | ICD-10-CM | POA: Diagnosis not present

## 2020-07-27 DIAGNOSIS — Z03818 Encounter for observation for suspected exposure to other biological agents ruled out: Secondary | ICD-10-CM | POA: Diagnosis not present

## 2020-08-15 DIAGNOSIS — Z03818 Encounter for observation for suspected exposure to other biological agents ruled out: Secondary | ICD-10-CM | POA: Diagnosis not present

## 2020-08-16 DIAGNOSIS — Z03818 Encounter for observation for suspected exposure to other biological agents ruled out: Secondary | ICD-10-CM | POA: Diagnosis not present

## 2020-08-22 DIAGNOSIS — Z03818 Encounter for observation for suspected exposure to other biological agents ruled out: Secondary | ICD-10-CM | POA: Diagnosis not present

## 2020-08-28 DIAGNOSIS — Z03818 Encounter for observation for suspected exposure to other biological agents ruled out: Secondary | ICD-10-CM | POA: Diagnosis not present

## 2020-09-05 DIAGNOSIS — Z03818 Encounter for observation for suspected exposure to other biological agents ruled out: Secondary | ICD-10-CM | POA: Diagnosis not present

## 2020-09-06 DIAGNOSIS — Z0000 Encounter for general adult medical examination without abnormal findings: Secondary | ICD-10-CM | POA: Diagnosis not present

## 2020-09-06 DIAGNOSIS — F419 Anxiety disorder, unspecified: Secondary | ICD-10-CM | POA: Diagnosis not present

## 2020-09-06 DIAGNOSIS — L718 Other rosacea: Secondary | ICD-10-CM | POA: Diagnosis not present

## 2020-09-06 DIAGNOSIS — L0889 Other specified local infections of the skin and subcutaneous tissue: Secondary | ICD-10-CM | POA: Diagnosis not present

## 2020-09-06 DIAGNOSIS — L239 Allergic contact dermatitis, unspecified cause: Secondary | ICD-10-CM | POA: Diagnosis not present

## 2020-09-06 DIAGNOSIS — Z13220 Encounter for screening for lipoid disorders: Secondary | ICD-10-CM | POA: Diagnosis not present

## 2020-09-12 DIAGNOSIS — Z03818 Encounter for observation for suspected exposure to other biological agents ruled out: Secondary | ICD-10-CM | POA: Diagnosis not present

## 2020-09-13 DIAGNOSIS — H04123 Dry eye syndrome of bilateral lacrimal glands: Secondary | ICD-10-CM | POA: Diagnosis not present

## 2020-09-13 DIAGNOSIS — H5212 Myopia, left eye: Secondary | ICD-10-CM | POA: Diagnosis not present

## 2020-09-18 DIAGNOSIS — Z03818 Encounter for observation for suspected exposure to other biological agents ruled out: Secondary | ICD-10-CM | POA: Diagnosis not present

## 2020-09-26 DIAGNOSIS — Z03818 Encounter for observation for suspected exposure to other biological agents ruled out: Secondary | ICD-10-CM | POA: Diagnosis not present

## 2020-10-02 DIAGNOSIS — J0110 Acute frontal sinusitis, unspecified: Secondary | ICD-10-CM | POA: Diagnosis not present

## 2020-10-03 DIAGNOSIS — Z03818 Encounter for observation for suspected exposure to other biological agents ruled out: Secondary | ICD-10-CM | POA: Diagnosis not present

## 2020-10-05 DIAGNOSIS — Z20822 Contact with and (suspected) exposure to covid-19: Secondary | ICD-10-CM | POA: Diagnosis not present

## 2020-10-11 DIAGNOSIS — Z03818 Encounter for observation for suspected exposure to other biological agents ruled out: Secondary | ICD-10-CM | POA: Diagnosis not present

## 2020-10-13 DIAGNOSIS — F411 Generalized anxiety disorder: Secondary | ICD-10-CM | POA: Diagnosis not present

## 2020-10-14 DIAGNOSIS — B373 Candidiasis of vulva and vagina: Secondary | ICD-10-CM | POA: Diagnosis not present

## 2020-10-17 DIAGNOSIS — Z03818 Encounter for observation for suspected exposure to other biological agents ruled out: Secondary | ICD-10-CM | POA: Diagnosis not present

## 2020-10-19 DIAGNOSIS — F411 Generalized anxiety disorder: Secondary | ICD-10-CM | POA: Diagnosis not present

## 2020-10-24 DIAGNOSIS — Z03818 Encounter for observation for suspected exposure to other biological agents ruled out: Secondary | ICD-10-CM | POA: Diagnosis not present

## 2020-10-31 DIAGNOSIS — Z03818 Encounter for observation for suspected exposure to other biological agents ruled out: Secondary | ICD-10-CM | POA: Diagnosis not present

## 2020-11-03 DIAGNOSIS — Z20822 Contact with and (suspected) exposure to covid-19: Secondary | ICD-10-CM | POA: Diagnosis not present

## 2020-11-14 DIAGNOSIS — Z1151 Encounter for screening for human papillomavirus (HPV): Secondary | ICD-10-CM | POA: Diagnosis not present

## 2020-11-14 DIAGNOSIS — Z01419 Encounter for gynecological examination (general) (routine) without abnormal findings: Secondary | ICD-10-CM | POA: Diagnosis not present

## 2020-11-14 DIAGNOSIS — Z6823 Body mass index (BMI) 23.0-23.9, adult: Secondary | ICD-10-CM | POA: Diagnosis not present

## 2020-11-15 DIAGNOSIS — Z03818 Encounter for observation for suspected exposure to other biological agents ruled out: Secondary | ICD-10-CM | POA: Diagnosis not present

## 2020-11-21 DIAGNOSIS — Z03818 Encounter for observation for suspected exposure to other biological agents ruled out: Secondary | ICD-10-CM | POA: Diagnosis not present

## 2020-11-28 DIAGNOSIS — Z03818 Encounter for observation for suspected exposure to other biological agents ruled out: Secondary | ICD-10-CM | POA: Diagnosis not present

## 2020-12-25 DIAGNOSIS — Z20822 Contact with and (suspected) exposure to covid-19: Secondary | ICD-10-CM | POA: Diagnosis not present

## 2020-12-25 DIAGNOSIS — Z03818 Encounter for observation for suspected exposure to other biological agents ruled out: Secondary | ICD-10-CM | POA: Diagnosis not present

## 2021-01-09 DIAGNOSIS — Z03818 Encounter for observation for suspected exposure to other biological agents ruled out: Secondary | ICD-10-CM | POA: Diagnosis not present

## 2021-01-16 DIAGNOSIS — Z03818 Encounter for observation for suspected exposure to other biological agents ruled out: Secondary | ICD-10-CM | POA: Diagnosis not present

## 2021-01-16 DIAGNOSIS — Z20822 Contact with and (suspected) exposure to covid-19: Secondary | ICD-10-CM | POA: Diagnosis not present

## 2021-01-16 DIAGNOSIS — M67911 Unspecified disorder of synovium and tendon, right shoulder: Secondary | ICD-10-CM | POA: Diagnosis not present

## 2021-01-16 DIAGNOSIS — M25511 Pain in right shoulder: Secondary | ICD-10-CM | POA: Diagnosis not present

## 2021-01-18 DIAGNOSIS — L820 Inflamed seborrheic keratosis: Secondary | ICD-10-CM | POA: Diagnosis not present

## 2021-01-23 DIAGNOSIS — Z20822 Contact with and (suspected) exposure to covid-19: Secondary | ICD-10-CM | POA: Diagnosis not present

## 2021-01-23 DIAGNOSIS — Z03818 Encounter for observation for suspected exposure to other biological agents ruled out: Secondary | ICD-10-CM | POA: Diagnosis not present

## 2021-02-13 DIAGNOSIS — Z20822 Contact with and (suspected) exposure to covid-19: Secondary | ICD-10-CM | POA: Diagnosis not present

## 2021-02-13 DIAGNOSIS — Z03818 Encounter for observation for suspected exposure to other biological agents ruled out: Secondary | ICD-10-CM | POA: Diagnosis not present

## 2021-02-21 DIAGNOSIS — L905 Scar conditions and fibrosis of skin: Secondary | ICD-10-CM | POA: Diagnosis not present

## 2021-02-21 DIAGNOSIS — L570 Actinic keratosis: Secondary | ICD-10-CM | POA: Diagnosis not present

## 2021-02-26 DIAGNOSIS — Z03818 Encounter for observation for suspected exposure to other biological agents ruled out: Secondary | ICD-10-CM | POA: Diagnosis not present

## 2021-03-06 DIAGNOSIS — Z03818 Encounter for observation for suspected exposure to other biological agents ruled out: Secondary | ICD-10-CM | POA: Diagnosis not present

## 2021-03-06 DIAGNOSIS — T3695XA Adverse effect of unspecified systemic antibiotic, initial encounter: Secondary | ICD-10-CM | POA: Diagnosis not present

## 2021-03-06 DIAGNOSIS — Z20822 Contact with and (suspected) exposure to covid-19: Secondary | ICD-10-CM | POA: Diagnosis not present

## 2021-03-06 DIAGNOSIS — B379 Candidiasis, unspecified: Secondary | ICD-10-CM | POA: Diagnosis not present

## 2021-03-06 DIAGNOSIS — J0140 Acute pansinusitis, unspecified: Secondary | ICD-10-CM | POA: Diagnosis not present

## 2021-03-13 DIAGNOSIS — Z03818 Encounter for observation for suspected exposure to other biological agents ruled out: Secondary | ICD-10-CM | POA: Diagnosis not present

## 2021-03-26 DIAGNOSIS — Z03818 Encounter for observation for suspected exposure to other biological agents ruled out: Secondary | ICD-10-CM | POA: Diagnosis not present

## 2021-04-10 DIAGNOSIS — Z03818 Encounter for observation for suspected exposure to other biological agents ruled out: Secondary | ICD-10-CM | POA: Diagnosis not present

## 2021-04-18 NOTE — Progress Notes (Signed)
Office Visit Note  Patient: Leah Ball             Date of Birth: [birth year]           MRN: [id number]             PCP: Jarrett Soho, PA-C Referring: Jarrett Soho, PA-C Visit Date: 04/19/2021 Occupation: @GUAROCC @  Subjective:  Follow-up (Right shoulder pain)   History of Present Illness: Leah Ball is a 40 y.o. female with history of inflammatory arthritis.  She states that the last fall she reached out back in her car and had some popping sensation in her shoulder.  She has been having pain and discomfort in her shoulder since then.  She also has difficulty reaching back.  She was seen by the [facility] in January who did x-rays and cortisone injection.  She was given a handout on exercises.  She did not notice any improvement from the cortisone shot and the symptoms persist.  Is not having any discomfort in her hands.  She is off-and-on discomfort in her left knee joint.  She notices occasional swelling in her knee joint.  None of the other joints are painful.  Activities of Daily Living:  Patient reports morning stiffness for 15 minutes.   Patient Reports nocturnal pain.  Difficulty dressing/grooming: Reports Difficulty climbing stairs: Denies Difficulty getting out of chair: Denies Difficulty using hands for taps, buttons, cutlery, and/or writing: Denies  Review of Systems  Constitutional: Positive for fatigue. Negative for night sweats, weight gain and weight loss.  HENT: Positive for mouth dryness. Negative for mouth sores, trouble swallowing, trouble swallowing and nose dryness.   Eyes: Positive for dryness. Negative for pain, redness and visual disturbance.  Respiratory: Negative for cough, shortness of breath and difficulty breathing.   Cardiovascular: Negative for chest pain, palpitations, hypertension, irregular heartbeat and swelling in legs/feet.  Gastrointestinal: Negative for blood in stool, constipation and diarrhea.  Endocrine: Positive for cold  intolerance and heat intolerance. Negative for increased urination.  Genitourinary: Negative for difficulty urinating and vaginal dryness.  Musculoskeletal: Positive for arthralgias, joint pain, joint swelling, muscle weakness, morning stiffness and muscle tenderness. Negative for myalgias and myalgias.  Skin: Positive for rash. Negative for color change, hair loss, skin tightness, ulcers and sensitivity to sunlight.       Rosacea  Allergic/Immunologic: Positive for susceptible to infections.  Neurological: Positive for dizziness and weakness. Negative for memory loss and night sweats.  Hematological: Negative for bruising/bleeding tendency and swollen glands.  Psychiatric/Behavioral: Positive for sleep disturbance. Negative for depressed mood. The patient is not nervous/anxious.     PMFS History:  There are no problems to display for this patient.   Past Medical History:  Diagnosis Date  . Rheumatoid arthritis (HCC)   . Rosacea     Family History  Problem Relation Age of Onset  . Heart attack Father   . High Cholesterol Father   . High Cholesterol Sister   . Healthy Son    Past Surgical History:  Procedure Laterality Date  . dental implant  2006  . WISDOM TOOTH EXTRACTION  1999   Social History   Social History Narrative  . Not on file   Immunization History  Administered Date(s) Administered  . PFIZER(Purple Top)SARS-COV-2 Vaccination 02/18/2020, 03/21/2020     Objective: Vital Signs: BP (!) 78/57 (BP Location: Left Arm, Patient Position: Sitting, Cuff Size: Normal)   Pulse 83   Resp 14   Ht 5\' 6"  (1.676 m)   Wt  155 lb 12.8 oz (70.7 kg)   BMI 25.15 kg/m    Physical Exam Vitals and nursing note reviewed.  Constitutional:      Appearance: She is well-developed.  HENT:     Head: Normocephalic and atraumatic.  Eyes:     Conjunctiva/sclera: Conjunctivae normal.  Cardiovascular:     Rate and Rhythm: Normal rate and regular rhythm.     Heart sounds: Normal heart  sounds.  Pulmonary:     Effort: Pulmonary effort is normal.     Breath sounds: Normal breath sounds.  Abdominal:     General: Bowel sounds are normal.     Palpations: Abdomen is soft.  Musculoskeletal:     Cervical back: Normal range of motion.  Lymphadenopathy:     Cervical: No cervical adenopathy.  Skin:    General: Skin is warm and dry.     Capillary Refill: Capillary refill takes less than 2 seconds.  Neurological:     Mental Status: She is alert and oriented to person, place, and time.  Psychiatric:        Behavior: Behavior normal.      Musculoskeletal Exam: C-spine was in good range of motion.  She had pain with range of motion of her right shoulder joint with abduction and internal rotation.  Left shoulder joint was in good range of motion.  Elbow joints, wrist joints, MCPs PIPs and DIPs with good range of motion with no synovitis.  Hip joints, knee joints, ankles, MTPs and PIPs in good range of motion with no synovitis.  CDAI Exam: CDAI Score: -- Patient Global: --; Provider Global: -- Swollen: --; Tender: -- Joint Exam 04/19/2021   No joint exam has been documented for this visit   There is currently no information documented on the homunculus. Go to the Rheumatology activity and complete the homunculus joint exam.  Investigation: No additional findings.  Imaging: No results found.  Recent Labs: Lab Results  Component Value Date   WBC 4.1 04/20/2018   HGB 13.5 04/20/2018   PLT 379 04/20/2018   NA 141 04/20/2018   K 5.4 (H) 04/20/2018   CL 105 04/20/2018   CO2 29 04/20/2018   GLUCOSE 96 04/20/2018   BUN 8 04/20/2018   CREATININE 0.75 04/20/2018   BILITOT 0.5 04/20/2018   AST 15 04/20/2018   ALT 9 04/20/2018   PROT 7.6 04/20/2018   CALCIUM 10.3 (H) 04/20/2018   GFRAA 119 04/20/2018   QFTBGOLDPLUS NEGATIVE 04/20/2018    Speciality Comments: No specialty comments available.  Procedures:  Large Joint Inj: R glenohumeral on 04/19/2021 11:21  AM Indications: pain Details: 27 G 1.5 in needle, posterior approach  Arthrogram: No  Medications: 40 mg triamcinolone acetonide 40 MG/ML; 1.5 mL lidocaine 1 % Aspirate: 0 mL Outcome: tolerated well, no immediate complications Procedure, treatment alternatives, risks and benefits explained, specific risks discussed. Consent was given by the patient. Immediately prior to procedure a time out was called to verify the correct patient, procedure, equipment, support staff and site/side marked as required. Patient was prepped and draped in the usual sterile fashion.     Allergies: Patient has no known allergies.   Assessment / Plan:     Visit Diagnoses: Chronic right shoulder pain-patient has been having pain and discomfort in her right shoulder joint since the last fall.  She states that the pain is started after reaching back in the car.  She was seen by Dr. Althea Charon in January and had x-ray which was unremarkable per  patient.  She also had a cortisone injection without much results.  She done some exercises at home which were not very helpful.  She has been having problems with range of motion of her right shoulder joint and discomfort.  We had detailed discussion regarding different treatment options.  She was in agreement to proceed with the cortisone injection.  Right glenohumeral joint was injected with cortisone as described above.  She taught the procedure well.  Postprocedure instructions were given.  I will also refer her to physical therapy.  We will see response to physical therapy.  If she has an adequate response we may consider MRI of her right shoulder joint.  Primary osteoarthritis of both hands - X-rays of both hands 04/20/18: Mild OA of both hands.  She has had to inflammatory arthritis and some of the PIPs in the past.  I do not see any synovitis today.  There is no need for immunosuppressive agents at this point.  I have advised her to contact me in case she develops any increased  swelling.  Chronic left knee pain-she gives history of intermittent pain and swelling in her left knee joint.  No warmth swelling or effusion was noted.  Inflammatory arthritis - Last OV on 04/20/18. Previously on SSZ. U/s of both hands negative for synovitis on 05/06/18.  She has had inflammatory arthritis in the past but had no synovitis on my examination today.  Orders: Orders Placed This Encounter  Procedures  . Large Joint Inj: R glenohumeral  . Ambulatory referral to Physical Therapy   No orders of the defined types were placed in this encounter.     Follow-Up Instructions: Return in about 3 months (around 07/20/2021) for Right shoulder pain.   Pollyann Savoy, MD  Note - This record has been created using Animal nutritionist.  Chart creation errors have been sought, but may not always  have been located. Such creation errors do not reflect on  the standard of medical care.

## 2021-04-19 ENCOUNTER — Ambulatory Visit: Payer: Federal, State, Local not specified - PPO | Admitting: Rheumatology

## 2021-04-19 ENCOUNTER — Encounter: Payer: Self-pay | Admitting: Rheumatology

## 2021-04-19 ENCOUNTER — Other Ambulatory Visit: Payer: Self-pay

## 2021-04-19 VITALS — BP 78/57 | HR 83 | Resp 14 | Ht 66.0 in | Wt 155.8 lb

## 2021-04-19 DIAGNOSIS — M25562 Pain in left knee: Secondary | ICD-10-CM | POA: Diagnosis not present

## 2021-04-19 DIAGNOSIS — M1990 Unspecified osteoarthritis, unspecified site: Secondary | ICD-10-CM | POA: Diagnosis not present

## 2021-04-19 DIAGNOSIS — M19042 Primary osteoarthritis, left hand: Secondary | ICD-10-CM

## 2021-04-19 DIAGNOSIS — M25511 Pain in right shoulder: Secondary | ICD-10-CM | POA: Diagnosis not present

## 2021-04-19 DIAGNOSIS — G8929 Other chronic pain: Secondary | ICD-10-CM

## 2021-04-19 DIAGNOSIS — M19041 Primary osteoarthritis, right hand: Secondary | ICD-10-CM | POA: Diagnosis not present

## 2021-04-19 MED ORDER — LIDOCAINE HCL 1 % IJ SOLN
1.5000 mL | INTRAMUSCULAR | Status: AC | PRN
  Administered 2021-04-19: 1.5 mL

## 2021-04-19 MED ORDER — TRIAMCINOLONE ACETONIDE 40 MG/ML IJ SUSP
40.0000 mg | INTRAMUSCULAR | Status: AC | PRN
  Administered 2021-04-19: 40 mg via INTRA_ARTICULAR

## 2021-04-19 NOTE — Patient Instructions (Signed)
Shoulder Exercises Ask your health care provider which exercises are safe for you. Do exercises exactly as told by your health care provider and adjust them as directed. It is normal to feel mild stretching, pulling, tightness, or discomfort as you do these exercises. Stop right away if you feel sudden pain or your pain gets worse. Do not begin these exercises until told by your health care provider. Stretching exercises External rotation and abduction This exercise is sometimes called corner stretch. This exercise rotates your arm outward (external rotation) and moves your arm out from your body (abduction). 1. Stand in a doorway with one of your feet slightly in front of the other. This is called a staggered stance. If you cannot reach your forearms to the door frame, stand facing a corner of a room. 2. Choose one of the following positions as told by your health care provider: ? Place your hands and forearms on the door frame above your head. ? Place your hands and forearms on the door frame at the height of your head. ? Place your hands on the door frame at the height of your elbows. 3. Slowly move your weight onto your front foot until you feel a stretch across your chest and in the front of your shoulders. Keep your head and chest upright and keep your abdominal muscles tight. 4. Hold for __________ seconds. 5. To release the stretch, shift your weight to your back foot. Repeat __________ times. Complete this exercise __________ times a day.   Extension, standing 1. Stand and hold a broomstick, a cane, or a similar object behind your back. ? Your hands should be a little wider than shoulder width apart. ? Your palms should face away from your back. 2. Keeping your elbows straight and your shoulder muscles relaxed, move the stick away from your body until you feel a stretch in your shoulders (extension). ? Avoid shrugging your shoulders while you move the stick. Keep your shoulder blades  tucked down toward the middle of your back. 3. Hold for __________ seconds. 4. Slowly return to the starting position. Repeat __________ times. Complete this exercise __________ times a day. Range-of-motion exercises Pendulum 1. Stand near a wall or a surface that you can hold onto for balance. 2. Bend at the waist and let your left / right arm hang straight down. Use your other arm to support you. Keep your back straight and do not lock your knees. 3. Relax your left / right arm and shoulder muscles, and move your hips and your trunk so your left / right arm swings freely. Your arm should swing because of the motion of your body, not because you are using your arm or shoulder muscles. 4. Keep moving your hips and trunk so your arm swings in the following directions, as told by your health care provider: ? Side to side. ? Forward and backward. ? In clockwise and counterclockwise circles. 5. Continue each motion for __________ seconds, or for as long as told by your health care provider. 6. Slowly return to the starting position. Repeat __________ times. Complete this exercise __________ times a day.   Shoulder flexion, standing 1. Stand and hold a broomstick, a cane, or a similar object. Place your hands a little more than shoulder width apart on the object. Your left / right hand should be palm up, and your other hand should be palm down. 2. Keep your elbow straight and your shoulder muscles relaxed. Push the stick up with your healthy   arm to raise your left / right arm in front of your body, and then over your head until you feel a stretch in your shoulder (flexion). ? Avoid shrugging your shoulder while you raise your arm. Keep your shoulder blade tucked down toward the middle of your back. 3. Hold for __________ seconds. 4. Slowly return to the starting position. Repeat __________ times. Complete this exercise __________ times a day.   Shoulder abduction, standing 1. Stand and hold a  broomstick, a cane, or a similar object. Place your hands a little more than shoulder width apart on the object. Your left / right hand should be palm up, and your other hand should be palm down. 2. Keep your elbow straight and your shoulder muscles relaxed. Push the object across your body toward your left / right side. Raise your left / right arm to the side of your body (abduction) until you feel a stretch in your shoulder. ? Do not raise your arm above shoulder height unless your health care provider tells you to do that. ? If directed, raise your arm over your head. ? Avoid shrugging your shoulder while you raise your arm. Keep your shoulder blade tucked down toward the middle of your back. 3. Hold for __________ seconds. 4. Slowly return to the starting position. Repeat __________ times. Complete this exercise __________ times a day. Internal rotation 1. Place your left / right hand behind your back, palm up. 2. Use your other hand to dangle an exercise band, a towel, or a similar object over your shoulder. Grasp the band with your left / right hand so you are holding on to both ends. 3. Gently pull up on the band until you feel a stretch in the front of your left / right shoulder. The movement of your arm toward the center of your body is called internal rotation. ? Avoid shrugging your shoulder while you raise your arm. Keep your shoulder blade tucked down toward the middle of your back. 4. Hold for __________ seconds. 5. Release the stretch by letting go of the band and lowering your hands. Repeat __________ times. Complete this exercise __________ times a day.   Strengthening exercises External rotation 1. Sit in a stable chair without armrests. 2. Secure an exercise band to a stable object at elbow height on your left / right side. 3. Place a soft object, such as a folded towel or a small pillow, between your left / right upper arm and your body to move your elbow about 4 inches (10  cm) away from your side. 4. Hold the end of the exercise band so it is tight and there is no slack. 5. Keeping your elbow pressed against the soft object, slowly move your forearm out, away from your abdomen (external rotation). Keep your body steady so only your forearm moves. 6. Hold for __________ seconds. 7. Slowly return to the starting position. Repeat __________ times. Complete this exercise __________ times a day.   Shoulder abduction 1. Sit in a stable chair without armrests, or stand up. 2. Hold a __________ weight in your left / right hand, or hold an exercise band with both hands. 3. Start with your arms straight down and your left / right palm facing in, toward your body. 4. Slowly lift your left / right hand out to your side (abduction). Do not lift your hand above shoulder height unless your health care provider tells you that this is safe. ? Keep your arms straight. ? Avoid   shrugging your shoulder while you do this movement. Keep your shoulder blade tucked down toward the middle of your back. 5. Hold for __________ seconds. 6. Slowly lower your arm, and return to the starting position. Repeat __________ times. Complete this exercise __________ times a day.   Shoulder extension 1. Sit in a stable chair without armrests, or stand up. 2. Secure an exercise band to a stable object in front of you so it is at shoulder height. 3. Hold one end of the exercise band in each hand. Your palms should face each other. 4. Straighten your elbows and lift your hands up to shoulder height. 5. Step back, away from the secured end of the exercise band, until the band is tight and there is no slack. 6. Squeeze your shoulder blades together as you pull your hands down to the sides of your thighs (extension). Stop when your hands are straight down by your sides. Do not let your hands go behind your body. 7. Hold for __________ seconds. 8. Slowly return to the starting position. Repeat __________  times. Complete this exercise __________ times a day. Shoulder row 1. Sit in a stable chair without armrests, or stand up. 2. Secure an exercise band to a stable object in front of you so it is at waist height. 3. Hold one end of the exercise band in each hand. Position your palms so that your thumbs are facing the ceiling (neutral position). 4. Bend each of your elbows to a 90-degree angle (right angle) and keep your upper arms at your sides. 5. Step back until the band is tight and there is no slack. 6. Slowly pull your elbows back behind you. 7. Hold for __________ seconds. 8. Slowly return to the starting position. Repeat __________ times. Complete this exercise __________ times a day. Shoulder press-ups 1. Sit in a stable chair that has armrests. Sit upright, with your feet flat on the floor. 2. Put your hands on the armrests so your elbows are bent and your fingers are pointing forward. Your hands should be about even with the sides of your body. 3. Push down on the armrests and use your arms to lift yourself off the chair. Straighten your elbows and lift yourself up as much as you comfortably can. ? Move your shoulder blades down, and avoid letting your shoulders move up toward your ears. ? Keep your feet on the ground. As you get stronger, your feet should support less of your body weight as you lift yourself up. 4. Hold for __________ seconds. 5. Slowly lower yourself back into the chair. Repeat __________ times. Complete this exercise __________ times a day.   Wall push-ups 1. Stand so you are facing a stable wall. Your feet should be about one arm-length away from the wall. 2. Lean forward and place your palms on the wall at shoulder height. 3. Keep your feet flat on the floor as you bend your elbows and lean forward toward the wall. 4. Hold for __________ seconds. 5. Straighten your elbows to push yourself back to the starting position. Repeat __________ times. Complete this  exercise __________ times a day.   This information is not intended to replace advice given to you by your health care provider. Make sure you discuss any questions you have with your health care provider. Document Revised: 03/26/2019 Document Reviewed: 01/01/2019 [employer] Patient Education  2021 [employer]  

## 2021-05-01 ENCOUNTER — Encounter: Payer: Self-pay | Admitting: Physical Therapy

## 2021-05-01 ENCOUNTER — Other Ambulatory Visit: Payer: Self-pay

## 2021-05-01 ENCOUNTER — Ambulatory Visit: Payer: Federal, State, Local not specified - PPO | Admitting: Physical Therapy

## 2021-05-01 DIAGNOSIS — M25511 Pain in right shoulder: Secondary | ICD-10-CM

## 2021-05-01 DIAGNOSIS — G8929 Other chronic pain: Secondary | ICD-10-CM | POA: Diagnosis not present

## 2021-05-01 DIAGNOSIS — M6281 Muscle weakness (generalized): Secondary | ICD-10-CM | POA: Diagnosis not present

## 2021-05-01 DIAGNOSIS — M25611 Stiffness of right shoulder, not elsewhere classified: Secondary | ICD-10-CM

## 2021-05-01 NOTE — Therapy (Signed)
[facility] Physical Therapy [address] [place], [place], [address] Phone: [phone]   Fax:  [phone]  Physical Therapy Evaluation  Patient Details  Name: Leah Ball MRN: [id number] Date of Birth: 1981/09/14 Referring Provider (PT): Dr. Pollyann Savoy   Encounter Date: 05/01/2021   PT End of Session - 05/01/21 0952    Visit Number 1    Number of Visits 13    Date for PT Re-Evaluation 06/30/21    Authorization Type BCBS    PT Start Time 0804    PT Stop Time 0845    PT Time Calculation (min) 41 min    Activity Tolerance Patient tolerated treatment well    Behavior During Therapy [facility] for tasks assessed/performed           Past Medical History:  Diagnosis Date  . Rheumatoid arthritis (HCC)   . Rosacea     Past Surgical History:  Procedure Laterality Date  . dental implant  2006  . WISDOM TOOTH EXTRACTION  1999    There were no vitals filed for this visit.    Subjective Assessment - 05/01/21 0808    Subjective Pt states the R shoulder pain in the fall as she was reaching back to close the door so the cat would not run out.  This was during the Fall when it first occured. She has tried some general reaching/ROM exercises but they do not seem to help much. She denies a hearing or feeling a pop as she went to go reach. Pt denies onset of neck pain or radiating sx down the arm. Denies redness, bruising, and swelling directly after. She states the pain would come down the outside of arm but never down past the elbow. Denies NT. Denies popping clicking catching locking within the shoulder. Aggs: reaching backwards, higher shelf, dressing, lifting; Eases: heating pad; Worst 7/10. Best 1/10, Current 0/10. Pt denies red flags and cancer questions. Pt is a mother and takes care of her son who plays travel baseball.    Pertinent History inflammatory OA, bilateral hand OA    Limitations Lifting    Diagnostic tests X-ray negative findings    Patient  Stated Goals Pt states she would like to get back to yoga, play with her son, and daily life without pain.    Currently in Pain? No/denies    Multiple Pain Sites No              [facility] PT Assessment - 05/01/21 0001      Assessment   Medical Diagnosis R shoulder pain    Referring Provider (PT) Dr. Corliss Skains, [facility]    Hand Dominance Right      Precautions   Precautions None      Restrictions   Weight Bearing Restrictions No      Balance Screen   Has the patient fallen in the past 6 months No    Has the patient had a decrease in activity level because of a fear of falling?  No    Is the patient reluctant to leave their home because of a fear of falling?  No      Home Environment   Living Environment Private residence    Living Arrangements Children    Type of Home House      Prior Function   Level of Independence Independent      Cognition   Overall Cognitive Status Within Functional Limits for tasks assessed      Observation/Other Assessments   Focus on  Therapeutic Outcomes (FOTO)  50 (predicted 70)      Sensation   Light Touch Appears Intact      Posture/Postural Control   Posture/Postural Control No significant limitations    Posture Comments slightly elevated and guarded      ROM / Strength   AROM / PROM / Strength AROM;PROM;Strength      AROM   Overall AROM Comments R flexion 112; abd 115; ER C4 reach; IR iliac crest reach   L WFL, C/S ROM WFL no recreation of pain     PROM   Overall PROM Comments L WNL; R flexion 120 with pain; ABD 100 with pain; IR 50 at 90 deg ABD; ER 65 at 90 deg ABD   guarding and apprehension with PROM     Strength   Overall Strength Comments L shoulder 5/5; R shoulder IR 4/5 with pain, ER 4/5, 4+/5 flexion and ABD; R biceps 4+/5      Palpation   Palpation comment decreased R shoulder capsular mobility/cuff guarding inf and post, referred pain with mobilization and palpation down lateral aspect of arm down to triceps and lateral  biceps; banding along ant and lateral deltoid, hypertonicity within infra      Special Tests    Special Tests Rotator Cuff Impingement;Biceps/Labral Tests    Rotator Cuff Impingment tests Neer impingement test;Hawkins- Kennedy test;Belly Press;Empty Can test;Full Can test;Painful Arc of Motion;Speed's test      Neer Impingement test    Findings Positive      Hawkins-Kennedy test   Findings Positive      Belly Press   Findings Negative      Empty Can test   Findings Negative      Full Can test   Findings Negative      Speed's test   Findings Negative      Painful Arc of Motion   Findings Positive         Negative Biceps Load II Negative Sulcus/ GHJ laxity             Objective measurements completed on examination: See above findings.       [facility] Adult PT Treatment/Exercise - 05/01/21 0001      Exercises   Exercises Shoulder      Shoulder Exercises: Supine   Other Supine Exercises AAROM flexion self 10x 5s;      Shoulder Exercises: Seated   Other Seated Exercises ER and IR iso at wall or self 15x 5s hold, no increase in pain >2                  PT Education - 05/01/21 0951    Education Details MOI, diagnosis, prognosis, anatomy, exercise progression, DOMS expectations, muscle firing, HEP, POC, joint protection    Person(s) Educated Patient    Methods Explanation;Demonstration;Tactile cues;Verbal cues;Other (comment)   email and text of HEP   Comprehension Verbalized understanding;Returned demonstration;Verbal cues required;Tactile cues required            PT Short Term Goals - 05/01/21 1211      PT SHORT TERM GOAL #1   Title Pt will become independent with HEP in order to demonstrate synthesis of PT education.    Time 2    Period Weeks    Status New      PT SHORT TERM GOAL #2   Title Pt will be able to demonstrate IR reach to bra line in order to demonstrate functional improvement in UE function for self-care and  house hold duties.     Time 4    Period Weeks    Status New             PT Long Term Goals - 05/01/21 1212      PT LONG TERM GOAL #1   Title Pt will demonstrate at least a score of 70 on FOTO in order to demonstrate a clinically significant and functional improvement R UE function for return to PLOF.    Time 6    Period Weeks    Status New      PT LONG TERM GOAL #2   Title Pt will be able to reach [facility] and grab/reach/hold >5 lbs in order to demonstrate functional improvement in R UE function for ADL, house hold duties, and self care.      PT LONG TERM GOAL #3   Title Pt will be able to demonstrate full downward dog position and side plank position in order to demonstrate functional improvement in UE function for return to full yoga and exercise/fitness activity.    Time 6    Period Weeks    Status New                  Plan - 05/01/21 6295    Clinical Impression Statement Pt presents with pain limited ROM, R shoulder weakness, and increased difficulty with OH and behind back reaching. Pt s/s are consistent with R shoulder strain, likely within RTC. Clinical testing does not suggest internal derangement at this time. Resisted IR pain referral pattern and MOI does suggest strain of internal rotators. Pt presents as stiff and painful with sx being moderately irritable and sensitive. Pt's impairments limit her full participation with exercise, ADL, and self care tasks. Pt would benefit from continued skilled therapy in order to reach goals and maximize functional R UE strength and ROM for full return to PLOF.    Personal Factors and Comorbidities Time since onset of injury/illness/exacerbation    Examination-Activity Limitations Bathing;Lift;Carry;Reach Overhead;Sleep    Examination-Participation Restrictions Community Activity;Laundry;Shop;Interpersonal Relationship;Other    Stability/Clinical Decision Making Stable/Uncomplicated    Clinical Decision Making Low    Rehab Potential Good    PT  Frequency 2x / week    PT Duration 6 weeks    PT Treatment/Interventions ADLs/Self Care Home Management;Electrical Stimulation;Cryotherapy;Iontophoresis 4mg /ml Dexamethasone;Moist Heat;Ultrasound;Therapeutic activities;Therapeutic exercise;Neuromuscular re-education;Patient/family education;Manual techniques;Scar mobilization;Passive range of motion;Dry needling;Taping;Vasopneumatic Device;Spinal Manipulations;Joint Manipulations    PT Next Visit Plan review HEP, scaption pulley, trial all push up, S/L ER, cane AAROM ER supine    PT Home Exercise Plan [email or web address]    Consulted and Agree with Plan of Care Patient           Patient will benefit from skilled therapeutic intervention in order to improve the following deficits and impairments:  Pain,Improper body mechanics,Postural dysfunction,Increased muscle spasms,Impaired flexibility,Hypomobility,Decreased strength,Decreased range of motion,Decreased activity tolerance,Impaired UE functional use  Visit Diagnosis: Decreased right shoulder range of motion  Chronic right shoulder pain  Muscle weakness (generalized)     Problem List There are no problems to display for this patient.   PT, DPT 05/01/21 12:18 PM   [facility] Physical Therapy [address] [place], [place], [place] Phone: [phone]   Fax:  [phone]  Name: Leah Ball MRN: Anselm Lis Date of Birth: 1981-03-06

## 2021-05-01 NOTE — Patient Instructions (Signed)
Access Code: [email or web address] URL: [email or web address] Date: 05/01/2021 Prepared by: Zebedee Iba  Exercises Supine Shoulder Flexion AAROM - 2 x daily - 7 x weekly - 1 sets - 10 reps - 5 hold Standing Isometric Shoulder Internal Rotation at Doorway - 2 x daily - 7 x weekly - 2 sets - 10 reps - 5 hold Standing Isometric Shoulder External Rotation with Doorway - 2 x daily - 7 x weekly - 2 sets - 10 reps - 5 hold

## 2021-05-08 ENCOUNTER — Other Ambulatory Visit: Payer: Self-pay

## 2021-05-08 ENCOUNTER — Ambulatory Visit: Payer: Federal, State, Local not specified - PPO | Admitting: Physical Therapy

## 2021-05-08 ENCOUNTER — Encounter: Payer: Self-pay | Admitting: Physical Therapy

## 2021-05-08 DIAGNOSIS — G8929 Other chronic pain: Secondary | ICD-10-CM | POA: Diagnosis not present

## 2021-05-08 DIAGNOSIS — M25611 Stiffness of right shoulder, not elsewhere classified: Secondary | ICD-10-CM

## 2021-05-08 DIAGNOSIS — M25511 Pain in right shoulder: Secondary | ICD-10-CM

## 2021-05-08 DIAGNOSIS — M6281 Muscle weakness (generalized): Secondary | ICD-10-CM | POA: Diagnosis not present

## 2021-05-08 NOTE — Therapy (Signed)
[facility] Therapy [address] [place], [place], [address] Phone: [phone]   Fax:  [phone]  Physical Therapy Treatment  Patient Details  Name: Leah Ball MRN: [id number] Date of Birth: 07/10/1981 Referring Provider (PT): Dr. Pollyann Savoy   Encounter Date: 05/08/2021   PT End of Session - 05/08/21 1054    Visit Number 2    Number of Visits 13    Date for PT Re-Evaluation 06/30/21    Authorization Type BCBS    PT Start Time 1015    PT Stop Time 1053    PT Time Calculation (min) 38 min    Activity Tolerance Patient tolerated treatment well    Behavior During Therapy [facility] for tasks assessed/performed           Past Medical History:  Diagnosis Date  . Rheumatoid arthritis (HCC)   . Rosacea     Past Surgical History:  Procedure Laterality Date  . dental implant  2006  . WISDOM TOOTH EXTRACTION  1999    There were no vitals filed for this visit.   Subjective Assessment - 05/08/21 1016    Subjective shoulder is feeling better, not as tight    Pertinent History inflammatory OA, bilateral hand OA    Limitations Lifting    Diagnostic tests X-ray negative findings    Patient Stated Goals Pt states she would like to get back to yoga, play with her son, and daily life without pain.    Currently in Pain? Yes    Pain Score 1     Pain Location Shoulder    Pain Orientation Right    Pain Descriptors / Indicators Tightness    Pain Type Acute pain;Chronic pain    Pain Onset More than a month ago    Pain Frequency Intermittent    Aggravating Factors  reaching back, overhead    Pain Relieving Factors heat                             [facility] Adult PT Treatment/Exercise - 05/08/21 1018      Shoulder Exercises: Supine   External Rotation AAROM;10 reps;Right    Other Supine Exercises AAROM flexion self 10x 5s      Shoulder Exercises: Sidelying   External Rotation Right;10 reps;Weights    External Rotation Weight  (lbs) 2    ABduction Right;10 reps;Weights    ABduction Weight (lbs) 2      Shoulder Exercises: Standing   Other Standing Exercises wall ladder flexion/scaption on Rt x 10 reps each      Shoulder Exercises: Pulleys   Flexion 3 minutes    Flexion Limitations 5 sec hold    Scaption 3 minutes    Scaption Limitations 5 sec hold      Shoulder Exercises: Isometric Strengthening   External Rotation Limitations Rt; 10x5"    Internal Rotation Limitations Rt; 10x5"                  PT Education - 05/08/21 1056    Education Details added to HEP    Person(s) Educated Patient    Methods Explanation;Demonstration;Handout    Comprehension Verbalized understanding;Returned demonstration;Need further instruction            PT Short Term Goals - 05/08/21 1054      PT SHORT TERM GOAL #1   Title Pt will become independent with HEP in order to demonstrate synthesis of PT education.    Time  2    Period Weeks    Status On-going      PT SHORT TERM GOAL #2   Title Pt will be able to demonstrate IR reach to bra line in order to demonstrate functional improvement in UE function for self-care and house hold duties.    Time 4    Period Weeks    Status On-going             PT Long Term Goals - 05/01/21 1212      PT LONG TERM GOAL #1   Title Pt will demonstrate at least a score of 70 on FOTO in order to demonstrate a clinically significant and functional improvement R UE function for return to PLOF.    Time 6    Period Weeks    Status New      PT LONG TERM GOAL #2   Title Pt will be able to reach [facility] and grab/reach/hold >5 lbs in order to demonstrate functional improvement in R UE function for ADL, house hold duties, and self care.      PT LONG TERM GOAL #3   Title Pt will be able to demonstrate full downward dog position and side plank position in order to demonstrate functional improvement in UE function for return to full yoga and exercise/fitness activity.    Time 6     Period Weeks    Status New                 Plan - 05/08/21 1054    Clinical Impression Statement Pt tolerated session well today demonstrating independence with initial HEP, and added 3 additional exercises today.  Will continue to benefit from PT to maximize function.  Goals ongoing at this time.    Personal Factors and Comorbidities Time since onset of injury/illness/exacerbation    Examination-Activity Limitations Bathing;Lift;Carry;Reach Overhead;Sleep    Examination-Participation Restrictions Community Activity;Laundry;Shop;Interpersonal Relationship;Other    Stability/Clinical Decision Making Stable/Uncomplicated    Rehab Potential Good    PT Frequency 2x / week    PT Duration 6 weeks    PT Treatment/Interventions ADLs/Self Care Home Management;Electrical Stimulation;Cryotherapy;Iontophoresis 4mg /ml Dexamethasone;Moist Heat;Ultrasound;Therapeutic activities;Therapeutic exercise;Neuromuscular re-education;Patient/family education;Manual techniques;Scar mobilization;Passive range of motion;Dry needling;Taping;Vasopneumatic Device;Spinal Manipulations;Joint Manipulations    PT Next Visit Plan review new HEP, scaption pulley, trial wall push up, gentle strengthening    PT Home Exercise Plan [email or web address]    Consulted and Agree with Plan of Care Patient           Patient will benefit from skilled therapeutic intervention in order to improve the following deficits and impairments:  Pain,Improper body mechanics,Postural dysfunction,Increased muscle spasms,Impaired flexibility,Hypomobility,Decreased strength,Decreased range of motion,Decreased activity tolerance,Impaired UE functional use  Visit Diagnosis: Decreased right shoulder range of motion  Chronic right shoulder pain  Muscle weakness (generalized)     Problem List There are no problems to display for this patient.     , PT, DPT 05/08/21 10:57 AM   [facility] Physical Therapy [address] [place], [place], [place] Phone: [phone]   Fax:  [phone]  Name: Leah Ball MRN: Anselm Lis Date of Birth: [birth year]

## 2021-05-08 NOTE — Patient Instructions (Signed)
Access Code: [email or web address] URL: [email or web address] Date: 05/08/2021 Prepared by: Moshe Cipro  Exercises Supine Shoulder Flexion AAROM - 2 x daily - 7 x weekly - 1 sets - 10 reps - 5 hold Standing Isometric Shoulder Internal Rotation at Doorway - 2 x daily - 7 x weekly - 2 sets - 10 reps - 5 hold Standing Isometric Shoulder External Rotation with Doorway - 2 x daily - 7 x weekly - 2 sets - 10 reps - 5 hold Supine Shoulder External Rotation AAROM with Dowel - 2 x daily - 7 x weekly - 2 sets - 10 reps - 5 hold Sidelying Shoulder ER with Towel and Dumbbell - 2 x daily - 7 x weekly - 2 sets - 10 reps Sidelying Shoulder Abduction Palm Forward - 2 x daily - 7 x weekly - 2 sets - 10 reps

## 2021-05-10 ENCOUNTER — Other Ambulatory Visit: Payer: Self-pay

## 2021-05-10 ENCOUNTER — Ambulatory Visit: Payer: Federal, State, Local not specified - PPO | Admitting: Rehabilitative and Restorative Service Providers"

## 2021-05-10 ENCOUNTER — Encounter: Payer: Self-pay | Admitting: Rehabilitative and Restorative Service Providers"

## 2021-05-10 DIAGNOSIS — M6281 Muscle weakness (generalized): Secondary | ICD-10-CM | POA: Diagnosis not present

## 2021-05-10 DIAGNOSIS — M25611 Stiffness of right shoulder, not elsewhere classified: Secondary | ICD-10-CM

## 2021-05-10 DIAGNOSIS — M25511 Pain in right shoulder: Secondary | ICD-10-CM | POA: Diagnosis not present

## 2021-05-10 DIAGNOSIS — G8929 Other chronic pain: Secondary | ICD-10-CM

## 2021-05-10 NOTE — Therapy (Signed)
[facility] Physical Therapy [address] [place], [place], [address] Phone: [phone]   Fax:  [phone]  Physical Therapy Treatment  Patient Details  Name: Leah Ball MRN: [id number] Date of Birth: 08/18/1981 Referring Provider (PT): Dr. Pollyann Savoy   Encounter Date: 05/10/2021   PT End of Session - 05/10/21 0802    Visit Number 3    Number of Visits 13    Date for PT Re-Evaluation 06/30/21    Authorization Type BCBS    PT Start Time 0800    PT Stop Time 0839    PT Time Calculation (min) 39 min    Activity Tolerance Patient tolerated treatment well    Behavior During Therapy [facility] for tasks assessed/performed           Past Medical History:  Diagnosis Date  . Rheumatoid arthritis (HCC)   . Rosacea     Past Surgical History:  Procedure Laterality Date  . dental implant  2006  . WISDOM TOOTH EXTRACTION  1999    There were no vitals filed for this visit.   Subjective Assessment - 05/10/21 0805    Subjective Pt. reported feeling a little sore after exercise at times but overall feeling like she is moving some better.  No specific pain to report upon arrival today at rest.    Pertinent History inflammatory OA, bilateral hand OA    Limitations Lifting    Diagnostic tests X-ray negative findings    Patient Stated Goals Pt states she would like to get back to yoga, play with her son, and daily life without pain.    Currently in Pain? No/denies    Pain Score 0-No pain    Pain Onset More than a month ago                             [facility] Adult PT Treatment/Exercise - 05/10/21 0001      Shoulder Exercises: Sidelying   External Rotation Limitations sidelying ER verball reviewed for HEP, performed standing version eccentric today    ABduction Right   2x 10   ABduction Weight (lbs) 2      Shoulder Exercises: Standing   Extension Both   2 x 15   Theraband Level (Shoulder Extension) Level 3 (Green)    Row Both   2 x 15  c scapular retraction focus   Theraband Level (Shoulder Row) Level 3 (Green)    Other Standing Exercises wall ladder flexion/scaption on Rt x 10 reps each, wall push up c SA press 2 x 10    Other Standing Exercises eccentric green band ER Rt c towel at side 3 x 10      Shoulder Exercises: Pulleys   Flexion 3 minutes    Flexion Limitations 5 sec hold    Scaption 3 minutes    Scaption Limitations 5 sec hold                    PT Short Term Goals - 05/08/21 1054      PT SHORT TERM GOAL #1   Title Pt will become independent with HEP in order to demonstrate synthesis of PT education.    Time 2    Period Weeks    Status On-going      PT SHORT TERM GOAL #2   Title Pt will be able to demonstrate IR reach to bra line in order to demonstrate functional improvement in UE function for  self-care and house hold duties.    Time 4    Period Weeks    Status On-going             PT Long Term Goals - 05/01/21 1212      PT LONG TERM GOAL #1   Title Pt will demonstrate at least a score of 70 on FOTO in order to demonstrate a clinically significant and functional improvement R UE function for return to PLOF.    Time 6    Period Weeks    Status New      PT LONG TERM GOAL #2   Title Pt will be able to reach [facility] and grab/reach/hold >5 lbs in order to demonstrate functional improvement in R UE function for ADL, house hold duties, and self care.      PT LONG TERM GOAL #3   Title Pt will be able to demonstrate full downward dog position and side plank position in order to demonstrate functional improvement in UE function for return to full yoga and exercise/fitness activity.    Time 6    Period Weeks    Status New                 Plan - 05/10/21 7858    Clinical Impression Statement Indications of progressing improvement c elevation mobility at this time per Pt. reports and demonstration in clinic.  Addition of scapular strengthening in rows, gh ext c resistance band performed  without complaints.  Began eccentric loading to posterior rotator cuff today  as well.    Personal Factors and Comorbidities Time since onset of injury/illness/exacerbation    Examination-Activity Limitations Bathing;Lift;Carry;Reach Overhead;Sleep    Examination-Participation Restrictions Community Activity;Laundry;Shop;Interpersonal Relationship;Other    Stability/Clinical Decision Making Stable/Uncomplicated    Rehab Potential Good    PT Frequency 2x / week    PT Duration 6 weeks    PT Treatment/Interventions ADLs/Self Care Home Management;Electrical Stimulation;Cryotherapy;Iontophoresis 4mg /ml Dexamethasone;Moist Heat;Ultrasound;Therapeutic activities;Therapeutic exercise;Neuromuscular re-education;Patient/family education;Manual techniques;Scar mobilization;Passive range of motion;Dry needling;Taping;Vasopneumatic Device;Spinal Manipulations;Joint Manipulations    PT Next Visit Plan Continue c plan for improved end range mobility progressively with supplemental strengthening in RTC and scapular musculature.    PT Home Exercise Plan [email or web address]    Consulted and Agree with Plan of Care Patient           Patient will benefit from skilled therapeutic intervention in order to improve the following deficits and impairments:  Pain,Improper body mechanics,Postural dysfunction,Increased muscle spasms,Impaired flexibility,Hypomobility,Decreased strength,Decreased range of motion,Decreased activity tolerance,Impaired UE functional use  Visit Diagnosis: Chronic right shoulder pain  Decreased right shoulder range of motion  Muscle weakness (generalized)     Problem List There are no problems to display for this patient.   , PT, DPT, OCS, ATC 05/10/21  8:34 AM    [facility] Physical Therapy [address] [place], [place], [place] Phone: [phone]   Fax:  [phone]  Name: Leah Ball MRN: Anselm Lis Date of Birth: 1981/10/21

## 2021-05-15 ENCOUNTER — Ambulatory Visit: Payer: Federal, State, Local not specified - PPO | Admitting: Physical Therapy

## 2021-05-15 ENCOUNTER — Encounter: Payer: Self-pay | Admitting: Physical Therapy

## 2021-05-15 ENCOUNTER — Other Ambulatory Visit: Payer: Self-pay

## 2021-05-15 DIAGNOSIS — M6281 Muscle weakness (generalized): Secondary | ICD-10-CM | POA: Diagnosis not present

## 2021-05-15 DIAGNOSIS — G8929 Other chronic pain: Secondary | ICD-10-CM | POA: Diagnosis not present

## 2021-05-15 DIAGNOSIS — M25611 Stiffness of right shoulder, not elsewhere classified: Secondary | ICD-10-CM | POA: Diagnosis not present

## 2021-05-15 DIAGNOSIS — M25511 Pain in right shoulder: Secondary | ICD-10-CM | POA: Diagnosis not present

## 2021-05-15 NOTE — Therapy (Addendum)
[facility] Physical Therapy [address] [place], [place], [address] Phone: [phone]   Fax:  [phone]  Physical Therapy Treatment/Discharge Summary  Patient Details  Name: Leah Ball MRN: [id number] Date of Birth: 1981/08/09 Referring Provider (PT): Dr. Bo Ball   Encounter Date: 05/15/2021   PT End of Session - 05/15/21 0845     Visit Number 4    Number of Visits 13    Date for PT Re-Evaluation 06/30/21    Authorization Type BCBS    PT Start Time 0804    PT Stop Time 0844    PT Time Calculation (min) 40 min    Activity Tolerance Patient tolerated treatment well    Behavior During Therapy [facility] for tasks assessed/performed             Past Medical History:  Diagnosis Date   Rheumatoid arthritis ([place])    Rosacea     Past Surgical History:  Procedure Laterality Date   dental implant  2006   [place] EXTRACTION  1999    There were no vitals filed for this visit.   Subjective Assessment - 05/15/21 0806     Subjective sore after last session but only lasted a couple of days    Pertinent History inflammatory OA, bilateral hand OA    Limitations Lifting    Diagnostic tests X-ray negative findings    Patient Stated Goals Pt states she would like to get back to yoga, play with her son, and daily life without pain.    Currently in Pain? No/denies    Pain Onset More than a month ago                               [facility] Adult PT Treatment/Exercise - 05/15/21 0808       Shoulder Exercises: Standing   External Rotation Right   2x15   Theraband Level (Shoulder External Rotation) Level 3 (Green)    External Rotation Limitations working on eccentric control    Extension Both   2 x 15   Theraband Level (Shoulder Extension) Level 3 (Green)    Row Both   2 x 15 c scapular retraction focus   Theraband Level (Shoulder Row) Level 3 (Green)    Other Standing Exercises IR/ER walkout with L3 band 2x15 reps       Shoulder Exercises: Pulleys   Flexion 3 minutes    Flexion Limitations 5 sec hold    Scaption 3 minutes    Scaption Limitations 5 sec hold      Shoulder Exercises: ROM/Strengthening   UBE (Upper Arm Bike) L2.5 x 6 min (3'each direction)    Wall Pushups --   2x10; counter height                     PT Short Term Goals - 05/15/21 0846       PT SHORT TERM GOAL #1   Title Pt will become independent with HEP in order to demonstrate synthesis of PT education.    Time 2    Period Weeks    Status Achieved      PT SHORT TERM GOAL #2   Title Pt will be able to demonstrate IR reach to bra line in order to demonstrate functional improvement in UE function for self-care and house hold duties.    Time 4    Period Weeks    Status On-going  PT Long Term Goals - 05/01/21 1212       PT LONG TERM GOAL #1   Title Pt will demonstrate at least a score of 70 on FOTO in order to demonstrate a clinically significant and functional improvement R UE function for return to PLOF.    Time 6    Period Weeks    Status New      PT LONG TERM GOAL #2   Title Pt will be able to reach [facility] and grab/reach/hold >5 lbs in order to demonstrate functional improvement in R UE function for ADL, house hold duties, and self care.      PT LONG TERM GOAL #3   Title Pt will be able to demonstrate full downward dog position and side plank position in order to demonstrate functional improvement in UE function for return to full yoga and exercise/fitness activity.    Time 6    Period Weeks    Status New                   Plan - 05/15/21 0569     Clinical Impression Statement Pt tolerated session well today and demonstrating great progress with PT.  Still has some difficulty with reaching behind the back so will address next visit.  Will continue to benefit from PT to maximize function.    Personal Factors and Comorbidities Time since onset of injury/illness/exacerbation     Examination-Activity Limitations Bathing;Lift;Carry;Reach Overhead;Sleep    Examination-Participation Restrictions Community Activity;Laundry;Shop;Interpersonal Relationship;Other    Stability/Clinical Decision Making Stable/Uncomplicated    Rehab Potential Good    PT Frequency 2x / week    PT Duration 6 weeks    PT Treatment/Interventions ADLs/Self Care Home Management;Electrical Stimulation;Cryotherapy;Iontophoresis 57m/ml Dexamethasone;Moist Heat;Ultrasound;Therapeutic activities;Therapeutic exercise;Neuromuscular re-education;Patient/family education;Manual techniques;Scar mobilization;Passive range of motion;Dry needling;Taping;Vasopneumatic Device;Spinal Manipulations;Joint Manipulations    PT Next Visit Plan Continue c plan for improved end range mobility progressively with supplemental strengthening in RTC and scapular musculature.    PT Home Exercise Plan X[place]and Agree with Plan of Care Patient             Patient will benefit from skilled therapeutic intervention in order to improve the following deficits and impairments:  Pain,Improper body mechanics,Postural dysfunction,Increased muscle spasms,Impaired flexibility,Hypomobility,Decreased strength,Decreased range of motion,Decreased activity tolerance,Impaired UE functional use  Visit Diagnosis: Chronic right shoulder pain  Decreased right shoulder range of motion  Muscle weakness (generalized)     Problem List There are no problems to display for this patient.     SLaureen Ball PT, DPT 05/15/21 8:48 AM    [facility] [address] [address]Phone: [phone]  Fax:  [phone] Name: Leah BelardoMRN: [id number] of Birth: [birth year]    PHYSICAL THERAPY DISCHARGE SUMMARY  Visits from Start of Care: 4  Current functional level related to goals / functional outcomes: See above   Remaining deficits: See above    Education / Equipment: HEP   Patient agrees to discharge. Patient goals were not met. Patient is being discharged due to the patient's request.  Pt's son had injury resulting in hospitalization, and pt needed to devote time to her son.  Please refer back to PT if needed in the future.  SLaureen Ball PT, DPT 07/30/21 11:38 AM

## 2021-05-18 ENCOUNTER — Encounter: Payer: Federal, State, Local not specified - PPO | Admitting: Rehabilitative and Restorative Service Providers"

## 2021-05-22 ENCOUNTER — Encounter: Payer: Federal, State, Local not specified - PPO | Admitting: Physical Therapy

## 2021-05-24 ENCOUNTER — Encounter: Payer: Federal, State, Local not specified - PPO | Admitting: Rehabilitative and Restorative Service Providers"

## 2021-05-28 ENCOUNTER — Encounter: Payer: Federal, State, Local not specified - PPO | Admitting: Physical Therapy

## 2021-05-30 ENCOUNTER — Encounter: Payer: Federal, State, Local not specified - PPO | Admitting: Physical Therapy

## 2021-07-05 NOTE — Progress Notes (Signed)
Office Visit Note  Patient: Leah Ball             Date of Birth: 11-04-1981           MRN: [id number]             PCP: Jarrett Soho, PA-C Referring: Jarrett Soho, PA-C Visit Date: 07/19/2021 Occupation: @GUAROCC @  Subjective:  Pain in multiple joints   History of Present Illness: Leah Ball is a 40 y.o. female with history of inflammatory arthritis. She previously was taking sulfasalazine but is not currently taking any immunosuppressive agents.  She has been experiencing increased pain and stiffness in multiple joints including both hands, both knees, and both feet.  She has noticed intermittent swelling in the left knee.  She has had some difficulty getting comfortable at night and occasionally has interrupted sleep.  She has been under increased rest recently while going through divorce which she feels has contributed to some of her increased joint pain.  She has had some discomfort on the lateral aspects of both hips as well as in her lower back.  She has been taking Advil as needed for pain relief. Patient reports that she was diagnosed with rosacea and has been seeing Dr. 41 at [facility] dermatology on a regular basis.  She denies any other new medical conditions or concerns.     Activities of Daily Living:  Patient reports morning stiffness for 15-20 minutes.   Patient Denies nocturnal pain.  Difficulty dressing/grooming: Denies Difficulty climbing stairs: Denies Difficulty getting out of chair: Denies Difficulty using hands for taps, buttons, cutlery, and/or writing: Reports  Review of Systems  Constitutional:  Positive for fatigue.  HENT:  Positive for mouth dryness and nose dryness. Negative for mouth sores.   Eyes:  Positive for redness and dryness. Negative for pain and itching.  Respiratory:  Negative for shortness of breath and difficulty breathing.   Cardiovascular:  Negative for chest pain and palpitations.  Gastrointestinal:  Negative for blood  in stool, constipation and diarrhea.  Endocrine: Negative for increased urination.  Genitourinary:  Negative for difficulty urinating.  Musculoskeletal:  Positive for joint pain, joint pain and morning stiffness. Negative for joint swelling, myalgias, muscle tenderness and myalgias.  Skin:  Negative for color change, rash and redness.  Allergic/Immunologic: Positive for susceptible to infections.  Neurological:  Positive for weakness. Negative for dizziness, numbness, headaches and memory loss.  Hematological:  Positive for bruising/bleeding tendency.  Psychiatric/Behavioral:  Negative for confusion.    PMFS History:  There are no problems to display for this patient.   Past Medical History:  Diagnosis Date   Rheumatoid arthritis ([facility])    Rosacea     Family History  Problem Relation Age of Onset   Heart attack Father    High Cholesterol Father    High Cholesterol Sister    Healthy Son    Past Surgical History:  Procedure Laterality Date   dental implant  2006   [place] TOOTH EXTRACTION  1999   Social History   Social History Narrative   Not on file   Immunization History  Administered Date(s) Administered   PFIZER(Purple Top)SARS-COV-2 Vaccination 02/18/2020, 03/21/2020, 10/18/2020     Objective: Vital Signs: BP 112/75 (BP Location: Left Arm, Patient Position: Sitting, Cuff Size: Normal)   Pulse 92   Ht 5\' 6"  (1.676 m)   Wt 151 lb 9.6 oz (68.8 kg)   BMI 24.47 kg/m    Physical Exam Vitals and nursing note reviewed.  Constitutional:  Appearance: She is well-developed.  HENT:     Head: Normocephalic and atraumatic.  Eyes:     Conjunctiva/sclera: Conjunctivae normal.  Pulmonary:     Effort: Pulmonary effort is normal.  Abdominal:     Palpations: Abdomen is soft.  Musculoskeletal:     Cervical back: Normal range of motion.  Skin:    General: Skin is warm and dry.     Capillary Refill: Capillary refill takes less than 2 seconds.  Neurological:     Mental  Status: She is alert and oriented to person, place, and time.  Psychiatric:        Behavior: Behavior normal.     Musculoskeletal Exam: C-spine, thoracic spine, and lumbar spine good ROM.  Shoulder joints, elbow joints, wrist joints, MCPs, PIPs, and DIPs good ROM with no synovitis.  Tenderness over several MCP and PIP joints as described below. Complete fist formation bilaterally.  Hip joints have good ROM with no discomfort.  Tenderness over bilateral trochanteric bursa.  Synovial thickening and some swelling of the left knee.  Right knee joint has good ROM with no discomfort.  Ankle joints have good ROM with no discomfort.  No tenderness or swelling of ankle joints.  Tenderness over the left 4th and 5th MTP joints.   CDAI Exam: CDAI Score: 11.8  Patient Global: 4 mm; Provider Global: 4 mm Swollen: 0 ; Tender: 15  Joint Exam 07/19/2021      Right  Left  MCP 2      Tender  MCP 3   Tender   Tender  MCP 4   Tender   Tender  MCP 5   Tender     PIP 2      Tender  PIP 3   Tender   Tender  PIP 4   Tender     Knee      Tender  Ankle   Tender   Tender  MTP 4      Tender  MTP 5      Tender     Investigation: No additional findings.  Imaging: No results found.  Recent Labs: Lab Results  Component Value Date   WBC 4.1 04/20/2018   HGB 13.5 04/20/2018   PLT 379 04/20/2018   NA 141 04/20/2018   K 5.4 (H) 04/20/2018   CL 105 04/20/2018   CO2 29 04/20/2018   GLUCOSE 96 04/20/2018   BUN 8 04/20/2018   CREATININE 0.75 04/20/2018   BILITOT 0.5 04/20/2018   AST 15 04/20/2018   ALT 9 04/20/2018   PROT 7.6 04/20/2018   CALCIUM 10.3 (H) 04/20/2018   GFRAA 119 04/20/2018   QFTBGOLDPLUS NEGATIVE 04/20/2018    Speciality Comments: No specialty comments available.  Procedures:  No procedures performed Allergies: Patient has no known allergies.   Assessment / Plan:     Visit Diagnoses: Inflammatory arthritis - Last OV on 04/20/18. Previously on SSZ. U/s of both hands negative for  synovitis on 05/06/18. Hx of inflammatory arthritis: She has no synovitis on examination today.  She presents today with increased pain in both multiple joints including both hands, both knees, and both feet.She has synovial thickening and some swelling of the left knee joint.  Tenderness over several MCP and PIP joints as described abov  She has been experiencing increased generalized stiffness and nocturnal pain.  She has been under increased stress recently since going through a divorce and moving which she feels has contributed to the worsening of her symptoms.  She  has taken Advil as needed which has provided some symptomatic relief.  She previously was on sulfasalazine but she has not been on any immunosuppressive agents in several years.  X-rays of both hands on 04/20/2018 were consistent with mild osteoarthritis.  She had an ultrasound of both hands performed on 05/06/2018 which was negative for synovitis.  Updated x-rays of both hands, left knee, and both feet were obtained today.  We also obtain the following lab work to complete the evaluation.  We will initiate a trial of meloxicam 7.5 mg twice daily CMP with GFR results. She will follow up in 4 weeks. - Plan: XR Hand 2 View Right, XR Hand 2 View Left, XR KNEE 3 VIEW LEFT, XR Foot 2 Views Right, XR Foot 2 Views Left, 14-3-3 eta Protein, Cyclic citrul peptide antibody, IgG, Rheumatoid factor, Sedimentation rate, ANA, C-reactive protein  Medication monitoring encounter - CBC and CMP drawn today.  Prescription for meloxicam pending lab results.  She will take meloxicam 7.5 mg twice daily as needed.  Plan: COMPLETE METABOLIC PANEL WITH GFR, CBC with Differential/Platelet  Chronic right shoulder pain: Resolved.  She has completed physical therapy.   Pain in both hands - She presents today with increased pain and stiffness in both hands.  She has tenderness over several MCP and PIP joints.  No obvious synovitis was noted on examination today.  X-rays and  ultrasound were obtained on 04/20/2018 which were consistent with mild osteoarthritis and did not reveal any synovitis.  X-rays of both hands were updated today.  We will obtain the following lab work today.  Plan: XR Hand 2 View Right, XR Hand 2 View Left, 14-3-3 eta Protein, Cyclic citrul peptide antibody, IgG, Rheumatoid factor, Sedimentation rate, ANA, C-reactive protein  Primary osteoarthritis of both hands - X-rays of both hands 04/20/18: Mild OA of both hands.  She has had to inflammatory arthritis and some of the PIPs in the past.  X-rays of both hands updated today.   Chronic pain of left knee -She presents today with increased discomfort in the left knee joint.  She has noticed intermittent swelling and stiffness in her left knee.  She is not experiencing any mechanical symptoms at this time.  She has been taking Advil as needed for symptomatic relief.  X-rays of the left knee obtained today.  Plan: XR KNEE 3 VIEW LEFT  Pain in both feet -She has been experiencing increased pain.  X-rays of both feet updated today. Plan: XR Foot 2 Views Right, XR Foot 2 Views Left  Orders: Orders Placed This Encounter  Procedures   XR Hand 2 View Right   XR Hand 2 View Left   XR KNEE 3 VIEW LEFT   XR Foot 2 Views Right   XR Foot 2 Views Left   14-3-3 eta Protein   Cyclic citrul peptide antibody, IgG   Rheumatoid factor   Sedimentation rate   COMPLETE METABOLIC PANEL WITH GFR   CBC with Differential/Platelet   ANA   C-reactive protein    No orders of the defined types were placed in this encounter.    Follow-Up Instructions: Return in about 4 weeks (around 08/16/2021) for Inflammatory arthritis .   Gearldine Bienenstock, PA-C  Note - This record has been created using Dragon software.  Chart creation errors have been sought, but may not always  have been located. Such creation errors do not reflect on  the standard of medical care.

## 2021-07-10 DIAGNOSIS — H66003 Acute suppurative otitis media without spontaneous rupture of ear drum, bilateral: Secondary | ICD-10-CM | POA: Diagnosis not present

## 2021-07-10 DIAGNOSIS — B379 Candidiasis, unspecified: Secondary | ICD-10-CM | POA: Diagnosis not present

## 2021-07-10 DIAGNOSIS — T3695XA Adverse effect of unspecified systemic antibiotic, initial encounter: Secondary | ICD-10-CM | POA: Diagnosis not present

## 2021-07-10 DIAGNOSIS — J0140 Acute pansinusitis, unspecified: Secondary | ICD-10-CM | POA: Diagnosis not present

## 2021-07-19 ENCOUNTER — Ambulatory Visit: Payer: Self-pay

## 2021-07-19 ENCOUNTER — Ambulatory Visit: Payer: Federal, State, Local not specified - PPO | Admitting: Physician Assistant

## 2021-07-19 ENCOUNTER — Other Ambulatory Visit: Payer: Self-pay

## 2021-07-19 ENCOUNTER — Encounter: Payer: Self-pay | Admitting: Physician Assistant

## 2021-07-19 VITALS — BP 112/75 | HR 92 | Ht 66.0 in | Wt 151.6 lb

## 2021-07-19 DIAGNOSIS — M19041 Primary osteoarthritis, right hand: Secondary | ICD-10-CM

## 2021-07-19 DIAGNOSIS — M79671 Pain in right foot: Secondary | ICD-10-CM | POA: Diagnosis not present

## 2021-07-19 DIAGNOSIS — M79672 Pain in left foot: Secondary | ICD-10-CM | POA: Diagnosis not present

## 2021-07-19 DIAGNOSIS — M79641 Pain in right hand: Secondary | ICD-10-CM

## 2021-07-19 DIAGNOSIS — M25562 Pain in left knee: Secondary | ICD-10-CM | POA: Diagnosis not present

## 2021-07-19 DIAGNOSIS — G8929 Other chronic pain: Secondary | ICD-10-CM | POA: Diagnosis not present

## 2021-07-19 DIAGNOSIS — M79642 Pain in left hand: Secondary | ICD-10-CM | POA: Diagnosis not present

## 2021-07-19 DIAGNOSIS — M1990 Unspecified osteoarthritis, unspecified site: Secondary | ICD-10-CM | POA: Diagnosis not present

## 2021-07-19 DIAGNOSIS — M25511 Pain in right shoulder: Secondary | ICD-10-CM | POA: Diagnosis not present

## 2021-07-19 DIAGNOSIS — M19042 Primary osteoarthritis, left hand: Secondary | ICD-10-CM

## 2021-07-19 DIAGNOSIS — Z5181 Encounter for therapeutic drug level monitoring: Secondary | ICD-10-CM

## 2021-07-19 NOTE — Telephone Encounter (Signed)
Pending lab results, patient will be starting meloxicam per Sherron Ales, PA-C. Thanks!

## 2021-07-19 NOTE — Progress Notes (Signed)
Please call the patient with x-ray results. X-rays of both hands revealed early OA.  X-rays of both feet and left knee were unremarkable.

## 2021-07-20 MED ORDER — MELOXICAM 7.5 MG PO TABS: 7.5000 mg | ORAL_TABLET | Freq: Two times a day (BID) | ORAL | 0 refills | Status: DC

## 2021-07-20 NOTE — Telephone Encounter (Signed)
Per office note on 07/19/2021: We will initiate a trial of meloxicam 7.5 mg twice daily.  CBC: Hct 46.5, CMP WNL

## 2021-07-23 NOTE — Progress Notes (Signed)
Anti-CCP and RF negative.  ANA negative. ESR and CRP WNL. CBC and CMP WNL.

## 2021-07-29 LAB — COMPLETE METABOLIC PANEL WITH GFR
AG Ratio: 1.6 (calc) (ref 1.0–2.5)
ALT: 16 U/L (ref 6–29)
AST: 21 U/L (ref 10–30)
Albumin: 4.6 g/dL (ref 3.6–5.1)
Alkaline phosphatase (APISO): 61 U/L (ref 31–125)
BUN: 12 mg/dL (ref 7–25)
CO2: 26 mmol/L (ref 20–32)
Calcium: 9.9 mg/dL (ref 8.6–10.2)
Chloride: 103 mmol/L (ref 98–110)
Creat: 0.75 mg/dL (ref 0.50–0.99)
Globulin: 2.8 g/dL (calc) (ref 1.9–3.7)
Glucose, Bld: 76 mg/dL (ref 65–99)
Potassium: 4.7 mmol/L (ref 3.5–5.3)
Sodium: 138 mmol/L (ref 135–146)
Total Bilirubin: 0.5 mg/dL (ref 0.2–1.2)
Total Protein: 7.4 g/dL (ref 6.1–8.1)
eGFR: 103 mL/min/{1.73_m2} (ref >=60)

## 2021-07-29 LAB — CBC WITH DIFFERENTIAL/PLATELET
Absolute Monocytes: 292 cells/uL (ref 200–950)
Basophils Absolute: 39 cells/uL (ref 0–200)
Basophils Relative: 0.7 %
Eosinophils Absolute: 281 cells/uL (ref 15–500)
Eosinophils Relative: 5.1 %
HCT: 46.5 % — ABNORMAL HIGH (ref 35.0–45.0)
Hemoglobin: 15.1 g/dL (ref 11.7–15.5)
Lymphs Abs: 1452 cells/uL (ref 850–3900)
MCH: 30.9 pg (ref 27.0–33.0)
MCHC: 32.5 g/dL (ref 32.0–36.0)
MCV: 95.1 fL (ref 80.0–100.0)
MPV: 9.9 fL (ref 7.5–12.5)
Monocytes Relative: 5.3 %
Neutro Abs: 3438 cells/uL (ref 1500–7800)
Neutrophils Relative %: 62.5 %
Platelets: 395 10*3/uL (ref 140–400)
RBC: 4.89 10*6/uL (ref 3.80–5.10)
RDW: 12.2 % (ref 11.0–15.0)
Total Lymphocyte: 26.4 %
WBC: 5.5 10*3/uL (ref 3.8–10.8)

## 2021-07-29 LAB — ANA: Anti Nuclear Antibody (ANA): NEGATIVE

## 2021-07-29 LAB — SEDIMENTATION RATE: Sed Rate: 2 mm/h (ref 0–20)

## 2021-07-29 LAB — CYCLIC CITRUL PEPTIDE ANTIBODY, IGG: Cyclic Citrullin Peptide Ab: <16 UNITS

## 2021-07-29 LAB — RHEUMATOID FACTOR: Rhuematoid fact SerPl-aCnc: <14 IU/mL (ref <14)

## 2021-07-29 LAB — C-REACTIVE PROTEIN: CRP: 1 mg/L (ref <8.0)

## 2021-07-29 LAB — 14-3-3 ETA PROTEIN: 14-3-3 eta Protein: <0.2 ng/mL (ref <0.2)

## 2021-08-02 NOTE — Progress Notes (Signed)
Office Visit Note  Patient: Leah Ball             Date of Birth: 1981/03/02           MRN: [id number]             PCP: Marda Stalker, PA-C Referring: Marda Stalker, PA-C Visit Date: 08/16/2021 Occupation: @GUAROCC @  Subjective:  Medication monitoring   History of Present Illness: Leah Ball is a 40 y.o. female with history of inflammatory arthritis.  She is not currently taking any immunosuppressive agents.  She was started on a trial of meloxicam 7.5 mg 1 tablet by mouth twice daily as needed for pain relief after her last office visit on 07/19/2021.  She has been taking meloxicam 7.51 tablet every morning with food and has not needed to take an evening dose.  She has found meloxicam to be effective at managing her joint pain and inflammation.  She currently rates her pain a 4 out of 10 before taking meloxicam and with meloxicam her pain is a 1 out of 10.  She denies any joint swelling at this time.  She states that she has been able to increase her activity level.  She denies any nocturnal pain.  She has been tolerating meloxicam since she has started to take it with food.  Initially she had some GI upset which has since resolved.  She denies any new concerns at this time.   Activities of Daily Living:  Patient reports morning stiffness for 5-10 minutes.   Patient Denies nocturnal pain.  Difficulty dressing/grooming: Denies Difficulty climbing stairs: Denies Difficulty getting out of chair: Denies Difficulty using hands for taps, buttons, cutlery, and/or writing: Reports  Review of Systems  Constitutional:  Positive for fatigue.  HENT:  Positive for nose dryness. Negative for mouth sores and mouth dryness.   Eyes:  Positive for redness and dryness. Negative for pain and itching.  Respiratory:  Negative for shortness of breath and difficulty breathing.   Cardiovascular:  Negative for chest pain and palpitations.  Gastrointestinal:  Negative for blood in stool,  constipation and diarrhea.  Endocrine: Negative for increased urination.  Genitourinary:  Negative for difficulty urinating.  Musculoskeletal:  Positive for morning stiffness. Negative for joint pain, joint pain, joint swelling, myalgias, muscle tenderness and myalgias.  Skin:  Negative for color change, rash and redness.  Allergic/Immunologic: Positive for susceptible to infections.  Neurological:  Negative for dizziness, numbness, headaches, memory loss and weakness.  Hematological:  Positive for bruising/bleeding tendency.  Psychiatric/Behavioral:  Negative for confusion.    PMFS History:  There are no problems to display for this patient.   Past Medical History:  Diagnosis Date   Rheumatoid arthritis ([place])    Rosacea     Family History  Problem Relation Age of Onset   Heart attack Father    High Cholesterol Father    High Cholesterol Sister    Healthy Son    Past Surgical History:  Procedure Laterality Date   dental implant  2006   Guffey EXTRACTION  1999   Social History   Social History Narrative   Not on file   Immunization History  Administered Date(s) Administered   PFIZER(Purple Top)SARS-COV-2 Vaccination 02/18/2020, 03/21/2020, 10/18/2020     Objective: Vital Signs: BP 100/66 (BP Location: Left Arm, Patient Position: Sitting, Cuff Size: Normal)   Pulse 76   Ht 5' 6"  (1.676 m)   Wt 148 lb 9.6 oz (67.4 kg)   BMI 23.98 kg/m  Physical Exam Vitals and nursing note reviewed.  Constitutional:      Appearance: She is well-developed.  HENT:     Head: Normocephalic and atraumatic.  Eyes:     Conjunctiva/sclera: Conjunctivae normal.  Pulmonary:     Effort: Pulmonary effort is normal.  Abdominal:     Palpations: Abdomen is soft.  Musculoskeletal:     Cervical back: Normal range of motion.  Lymphadenopathy:     Cervical: No cervical adenopathy.  Skin:    General: Skin is warm and dry.     Capillary Refill: Capillary refill takes less than 2  seconds.  Neurological:     Mental Status: She is alert and oriented to person, place, and time.  Psychiatric:        Behavior: Behavior normal.     Musculoskeletal Exam: C-spine,, thoracic spine, lumbar spine have good range of motion with no discomfort.  Shoulder joints, elbow joints, wrist joints, MCPs, PIPs, DIPs have good range of motion with no synovitis.  Tenderness over the right fourth and fifth PIP joints.  Tenderness over the left third and fourth MCP joints noted.  No synovitis was apparent on examination today.  Complete fist formation bilaterally.  Hip joints, knee joints, and ankle joints have good range of motion with no discomfort.  No warmth or effusion of knee joints noted.  No tenderness or swelling of ankle joints.  CDAI Exam: CDAI Score: -- Patient Global: --; Provider Global: -- Swollen: --; Tender: -- Joint Exam 08/16/2021   No joint exam has been documented for this visit   There is currently no information documented on the homunculus. Go to the Rheumatology activity and complete the homunculus joint exam.  Investigation: No additional findings.  Imaging: XR Foot 2 Views Left  Result Date: 07/19/2021 No MTP, PIP or DIP narrowing was noted.  No intertarsal, tibiotalar or subtalar joint space narrowing was noted.  No erosive changes were noted. Impression: Unremarkable x-ray of the foot.  XR Foot 2 Views Right  Result Date: 07/19/2021 No MTP, PIP or DIP narrowing was noted.  No intertarsal, tibiotalar or subtalar joint space narrowing was noted.  No erosive changes were noted. Impression: Unremarkable x-ray of the foot.  XR Hand 2 View Left  Result Date: 07/19/2021 Mild CMC and PIP narrowing was noted.  No MCP, intercarpal or radiocarpal joint space narrowing was noted.  No erosive changes were noted. Impression: These findings are consistent with early osteoarthritis of the hand.  XR Hand 2 View Right  Result Date: 07/19/2021 Mild CMC and PIP narrowing was  noted.  No MCP, intercarpal or radiocarpal joint space narrowing was noted.  No erosive changes were noted. Impression: These findings are consistent with early osteoarthritis of the hand.  XR KNEE 3 VIEW LEFT  Result Date: 07/19/2021 No medial or lateral compartment narrowing was noted.  No patellofemoral narrowing was noted.  No chondrocalcinosis was noted. Impression: Unremarkable x-ray of the knee joint.   Recent Labs: Lab Results  Component Value Date   WBC 5.5 07/19/2021   HGB 15.1 07/19/2021   PLT 395 07/19/2021   NA 138 07/19/2021   K 4.7 07/19/2021   CL 103 07/19/2021   CO2 26 07/19/2021   GLUCOSE 76 07/19/2021   BUN 12 07/19/2021   CREATININE 0.75 07/19/2021   BILITOT 0.5 07/19/2021   AST 21 07/19/2021   ALT 16 07/19/2021   PROT 7.4 07/19/2021   CALCIUM 9.9 07/19/2021   GFRAA 119 04/20/2018   QFTBGOLDPLUS NEGATIVE  04/20/2018    Speciality Comments: No specialty comments available.  Procedures:  No procedures performed Allergies: Patient has no known allergies.   Assessment / Plan:     Visit Diagnoses: Inflammatory arthritis - Previously on SSZ. U/s of both hands negative for synovitis on 05/06/18. Hx of inflammatory arthritis: She has no synovitis on examination today.  She has some tenderness over the right fourth and fifth PIP joints and the left third and fourth MCP joints.  She has good range of motion of the left knee joint with no warmth or effusion on examination today.  Both ankle joints have good range of motion with no tenderness or synovitis.  She is not currently taking any immunosuppressive agents.  Lab work from 07/19/2021 was reviewed with the patient today in the office: ESR within normal limits, CRP within normal limits, ANA negative, RF negative, anti-CCP negative, and 14 3 3  eta negative.  X-rays of both hands were also obtained at that time which were consistent with early osteoarthritic changes.  X-rays of the left knee and both feet were unremarkable.   She was started on a trial of meloxicam 7.5 mg 1 tablet by mouth twice daily as needed for pain relief.  She initially noticed some GI upset when starting on meloxicam which has since resolved since taking meloxicam with food.  She has only been taking meloxicam once a day first thing in the morning which has been alleviating her discomfort.  Her joint pain and inflammation have improved significantly while taking meloxicam.  Discussed that if her joint pain persists or worsens or if she has to increase the dose of meloxicam we can schedule an ultrasound of both hands to assess for synovitis.  She is aware that she will have to hold meloxicam for at least 1 week prior to the ultrasound.  She will notify us if she develops increased joint pain or joint swelling. She will follow-up in the office in 5 months.  Medication monitoring encounter - Meloxicam 7.5 mg 1 tablet by mouth daily as needed for pain relief.  CMP within normal limits on 07/19/2021.  We will recheck CMP with GFR today since starting on meloxicam.  She will continue to require lab work every 6 months to monitor for drug toxicity.- Plan: COMPLETE METABOLIC PANEL WITH GFR  Chronic right shoulder pain - Resolved.  She has completed physical therapy.  She is good range of motion with no discomfort at this time.  Primary osteoarthritis of both hands: X-rays of both hands were obtained on 07/19/2021 which were consistent with early osteoarthritic changes.  No erosive changes were noted.  Her hand pain and stiffness have improved since starting to take meloxicam 7.5 mg 1 tablet daily for pain relief.  We discussed that if her discomfort persists or worsens we can schedule an ultrasound of both hands to assess for synovitis.  She is aware that she will have to hold meloxicam for at least 1 week prior to the ultrasound.  Chronic pain of left knee: X-rays of the left knee were obtained on 07/19/2021 which were unremarkable.  No joint space narrowing or  chondrocalcinosis was noted.  Her left knee joint pain has improved since starting to take meloxicam 7.51 tablet by mouth in the morning.  She has not had any nocturnal pain.  She has been able to increase her activity level.  She has no difficulty climbing steps or rising from a seated position.  On examination she has good range of motion  of the left knee joint with no warmth or effusion.  Pain in both feet: The pain in her feet has improved significantly since starting on meloxicam.  She had good range of motion of both ankle joints with no tenderness or synovitis.  Orders: Orders Placed This Encounter  Procedures   COMPLETE METABOLIC PANEL WITH GFR   No orders of the defined types were placed in this encounter.     Follow-Up Instructions: Return in about 5 months (around 01/16/2022) for Inflammatory arthritis .   Ofilia Neas, PA-C  Note - This record has been created using Dragon software.  Chart creation errors have been sought, but may not always  have been located. Such creation errors do not reflect on  the standard of medical care.

## 2021-08-16 ENCOUNTER — Other Ambulatory Visit: Payer: Self-pay | Admitting: Physician Assistant

## 2021-08-16 ENCOUNTER — Other Ambulatory Visit: Payer: Self-pay

## 2021-08-16 ENCOUNTER — Ambulatory Visit (INDEPENDENT_AMBULATORY_CARE_PROVIDER_SITE_OTHER): Payer: BC Managed Care – PPO | Admitting: Physician Assistant

## 2021-08-16 ENCOUNTER — Encounter: Payer: Self-pay | Admitting: Physician Assistant

## 2021-08-16 VITALS — BP 100/66 | HR 76 | Ht 66.0 in | Wt 148.6 lb

## 2021-08-16 DIAGNOSIS — Z5181 Encounter for therapeutic drug level monitoring: Secondary | ICD-10-CM

## 2021-08-16 DIAGNOSIS — M1990 Unspecified osteoarthritis, unspecified site: Secondary | ICD-10-CM | POA: Diagnosis not present

## 2021-08-16 DIAGNOSIS — M19041 Primary osteoarthritis, right hand: Secondary | ICD-10-CM

## 2021-08-16 DIAGNOSIS — M25511 Pain in right shoulder: Secondary | ICD-10-CM | POA: Diagnosis not present

## 2021-08-16 DIAGNOSIS — M19042 Primary osteoarthritis, left hand: Secondary | ICD-10-CM

## 2021-08-16 DIAGNOSIS — M79641 Pain in right hand: Secondary | ICD-10-CM

## 2021-08-16 DIAGNOSIS — M25562 Pain in left knee: Secondary | ICD-10-CM

## 2021-08-16 DIAGNOSIS — M79672 Pain in left foot: Secondary | ICD-10-CM

## 2021-08-16 DIAGNOSIS — M79671 Pain in right foot: Secondary | ICD-10-CM

## 2021-08-16 DIAGNOSIS — G8929 Other chronic pain: Secondary | ICD-10-CM

## 2021-08-16 LAB — COMPLETE METABOLIC PANEL WITH GFR
AG Ratio: 1.6 (calc) (ref 1.0–2.5)
ALT: 11 U/L (ref 6–29)
AST: 17 U/L (ref 10–30)
Albumin: 4.4 g/dL (ref 3.6–5.1)
Alkaline phosphatase (APISO): 62 U/L (ref 31–125)
BUN: 14 mg/dL (ref 7–25)
CO2: 30 mmol/L (ref 20–32)
Calcium: 9.8 mg/dL (ref 8.6–10.2)
Chloride: 104 mmol/L (ref 98–110)
Creat: 0.79 mg/dL (ref 0.50–0.99)
Globulin: 2.7 g/dL (calc) (ref 1.9–3.7)
Glucose, Bld: 88 mg/dL (ref 65–99)
Potassium: 5.3 mmol/L (ref 3.5–5.3)
Sodium: 140 mmol/L (ref 135–146)
Total Bilirubin: 0.6 mg/dL (ref 0.2–1.2)
Total Protein: 7.1 g/dL (ref 6.1–8.1)
eGFR: 97 mL/min/{1.73_m2} (ref >=60)

## 2021-08-16 MED ORDER — MELOXICAM 7.5 MG PO TABS: 7.5000 mg | ORAL_TABLET | Freq: Every day | ORAL | 0 refills | Status: DC | PRN

## 2021-08-16 NOTE — Telephone Encounter (Signed)
Next Visit: 08/16/2021  Last Visit: 07/19/2021  Last Fill: 07/20/2021  DX:  Inflammatory arthritis   Current Dose per office note 07/19/2021: meloxicam 7.5 mg twice daily as needed  Labs: 07/19/2021, Anti-CCP and RF negative.  ANA negative. ESR and CRP WNL. CBC and CMP WNL.  Okay to refill Mobic?

## 2021-08-16 NOTE — Patient Instructions (Signed)
Hand Exercises Hand exercises can be helpful for almost anyone. These exercises can strengthen the hands, improve flexibility and movement, and increase blood flow to the hands. These results can make work and daily tasks easier. Hand exercises can be especially helpful for people who have joint pain from arthritis or have nerve damage from overuse (carpal tunnel syndrome). These exercises can also help people who have injured a hand. Exercises Most of these hand exercises are gentle stretching and motion exercises. It is usually safe to do them often throughout the day. Warming up your hands before exercise may help to reduce stiffness. You can do this with gentle massage or by placing your hands in warm water for 10-15 minutes. It is normal to feel some stretching, pulling, tightness, or mild discomfort as you begin new exercises. This will gradually improve. Stop an exercise right away if you feel sudden, severe pain or your pain gets worse. Ask your health care provider which exercises are best for you. Knuckle bend or "claw" fist  Stand or sit with your arm, hand, and all five fingers pointed straight up. Make sure to keep your wrist straight during the exercise. Gently bend your fingers down toward your palm until the tips of your fingers are touching the top of your palm. Keep your big knuckle straight and just bend the small knuckles in your fingers. Hold this position for __________ seconds. Straighten (extend) your fingers back to the starting position. Repeat this exercise 5-10 times with each hand. Full finger fist  Stand or sit with your arm, hand, and all five fingers pointed straight up. Make sure to keep your wrist straight during the exercise. Gently bend your fingers into your palm until the tips of your fingers are touching the middle of your palm. Hold this position for __________ seconds. Extend your fingers back to the starting position, stretching every joint fully. Repeat  this exercise 5-10 times with each hand. Straight fist Stand or sit with your arm, hand, and all five fingers pointed straight up. Make sure to keep your wrist straight during the exercise. Gently bend your fingers at the big knuckle, where your fingers meet your hand, and the middle knuckle. Keep the knuckle at the tips of your fingers straight and try to touch the bottom of your palm. Hold this position for __________ seconds. Extend your fingers back to the starting position, stretching every joint fully. Repeat this exercise 5-10 times with each hand. Tabletop  Stand or sit with your arm, hand, and all five fingers pointed straight up. Make sure to keep your wrist straight during the exercise. Gently bend your fingers at the big knuckle, where your fingers meet your hand, as far down as you can while keeping the small knuckles in your fingers straight. Think of forming a tabletop with your fingers. Hold this position for __________ seconds. Extend your fingers back to the starting position, stretching every joint fully. Repeat this exercise 5-10 times with each hand. Finger spread  Place your hand flat on a table with your palm facing down. Make sure your wrist stays straight as you do this exercise. Spread your fingers and thumb apart from each other as far as you can until you feel a gentle stretch. Hold this position for __________ seconds. Bring your fingers and thumb tight together again. Hold this position for __________ seconds. Repeat this exercise 5-10 times with each hand. Making circles  Stand or sit with your arm, hand, and all five fingers pointed   straight up. Make sure to keep your wrist straight during the exercise. Make a circle by touching the tip of your thumb to the tip of your index finger. Hold for __________ seconds. Then open your hand wide. Repeat this motion with your thumb and each finger on your hand. Repeat this exercise 5-10 times with each hand. Thumb  motion  Sit with your forearm resting on a table and your wrist straight. Your thumb should be facing up toward the ceiling. Keep your fingers relaxed as you move your thumb. Lift your thumb up as high as you can toward the ceiling. Hold for __________ seconds. Bend your thumb across your palm as far as you can, reaching the tip of your thumb for the small finger (pinkie) side of your palm. Hold for __________ seconds. Repeat this exercise 5-10 times with each hand. Grip strengthening  Hold a stress ball or other soft ball in the middle of your hand. Slowly increase the pressure, squeezing the ball as much as you can without causing pain. Think of bringing the tips of your fingers into the middle of your palm. All of your finger joints should bend when doing this exercise. Hold your squeeze for __________ seconds, then relax. Repeat this exercise 5-10 times with each hand. Contact a health care provider if: Your hand pain or discomfort gets much worse when you do an exercise. Your hand pain or discomfort does not improve within 2 hours after you exercise. If you have any of these problems, stop doing these exercises right away. Do not do them again unless your health care provider says that you can. Get help right away if: You develop sudden, severe hand pain or swelling. If this happens, stop doing these exercises right away. Do not do them again unless your health care provider says that you can. This information is not intended to replace advice given to you by your health care provider. Make sure you discuss any questions you have with your health care provider. Document Revised: 03/22/2021 Document Reviewed: 03/22/2021 Elsevier Patient Education  2022 [employer]  

## 2021-08-16 NOTE — Telephone Encounter (Signed)
Patient declined a refill today since she is only taking meloxicam once daily.  Please reduce the dose of meloxicam to 7.5 mg 1 tablet daily as needed.   Dispense 90 tablets with zero refills.  No print.

## 2021-08-17 NOTE — Progress Notes (Signed)
CMP WNL

## 2021-09-13 ENCOUNTER — Telehealth: Payer: Self-pay

## 2021-09-13 DIAGNOSIS — R1013 Epigastric pain: Secondary | ICD-10-CM | POA: Diagnosis not present

## 2021-09-13 DIAGNOSIS — Z0000 Encounter for general adult medical examination without abnormal findings: Secondary | ICD-10-CM | POA: Diagnosis not present

## 2021-09-13 DIAGNOSIS — R7989 Other specified abnormal findings of blood chemistry: Secondary | ICD-10-CM | POA: Diagnosis not present

## 2021-09-13 NOTE — Telephone Encounter (Signed)
Patient left a voicemail stating she had an appointment with Ladona Ridgel on 08/16/21 and was given a new prescription.  Patient states she is having a lot of issues with side effects and wanted to talk to her to see if there is something else she could take.  Patient requested a return call.

## 2021-09-13 NOTE — Telephone Encounter (Signed)
Patient has been scheduled for 09/18/2021 at 9:40 to discuss treatment options.

## 2021-09-13 NOTE — Telephone Encounter (Signed)
Patient states she had an appointment with Ladona Ridgel on 08/16/21 and was given a new prescription for Mobic.  Patient states she is having pain in her abdomen when taking the Mobic. Patient states it was helpful but she is unable to tolerate the abdominal pain. Patient would like to know if there is anything else she can take.  Please advise.

## 2021-09-13 NOTE — Telephone Encounter (Signed)
I returned patient's call.  She is on meloxicam 7.5 mg p.o. daily.  She is unable to tolerate NSAIDs.  She stated that she has tried sulfasalazine in the past which was very effective but she had to discontinue because she became pregnant.  She would like to try sulfasalazine again.  Please schedule next available appointment to discuss treatment plan.

## 2021-09-17 DIAGNOSIS — R7989 Other specified abnormal findings of blood chemistry: Secondary | ICD-10-CM | POA: Diagnosis not present

## 2021-09-17 DIAGNOSIS — E785 Hyperlipidemia, unspecified: Secondary | ICD-10-CM | POA: Diagnosis not present

## 2021-09-17 NOTE — Progress Notes (Signed)
Office Visit Note  Patient: Leah Ball             Date of Birth: November 09, 1981           MRN: [id number]             PCP: Jarrett Soho, PA-C Referring: Jarrett Soho, PA-C Visit Date: 09/18/2021 Occupation: @GUAROCC @  Subjective:  Increased joint pain   History of Present Illness: Leah Ball is a 40 y.o. female with history of inflammatory arthritis.  Patient reports that she had to discontinue meloxicam about 2 weeks ago due to developing gastritis.  She states that she was taking meloxicam with food but did not notice any improvement in her symptoms.  She was evaluated by her PCP who advised her to discontinue the use of meloxicam.  She was started on Pepcid twice daily for 1 month which was started on Thursday.  She has started to notice some improvement in her symptoms.  She states that since discontinuing meloxicam she has been experiencing increased pain and stiffness in multiple joints including both hands and both knees.  She noticed some swelling in her left knee over the weekend which has since resolved.  Her right shoulder pain resolved after physical therapy.  She has been taking Tylenol on a daily basis for pain relief.    Activities of Daily Living:  Patient reports morning stiffness for 1 hour.   Patient Denies nocturnal pain.  Difficulty dressing/grooming: Denies Difficulty climbing stairs: Denies Difficulty getting out of chair: Denies Difficulty using hands for taps, buttons, cutlery, and/or writing: Reports  Review of Systems  Constitutional:  Positive for fatigue.  HENT:  Negative for mouth sores, mouth dryness and nose dryness.   Eyes:  Positive for dryness. Negative for pain and itching.  Respiratory:  Negative for shortness of breath and difficulty breathing.   Cardiovascular:  Negative for chest pain and palpitations.  Gastrointestinal:  Negative for blood in stool, constipation and diarrhea.  Endocrine: Negative for increased urination.   Genitourinary:  Negative for difficulty urinating.  Musculoskeletal:  Positive for joint pain, joint pain, joint swelling, myalgias, morning stiffness and myalgias. Negative for muscle tenderness.  Skin:  Negative for color change, rash and redness.  Allergic/Immunologic: Positive for susceptible to infections.  Neurological:  Positive for weakness. Negative for dizziness, numbness, headaches and memory loss.  Hematological:  Positive for bruising/bleeding tendency.  Psychiatric/Behavioral:  Negative for confusion.    PMFS History:  There are no problems to display for this patient.   Past Medical History:  Diagnosis Date   Rheumatoid arthritis (HCC)    Rosacea     Family History  Problem Relation Age of Onset   Heart attack Father    High Cholesterol Father    High Cholesterol Sister    Healthy Son    Past Surgical History:  Procedure Laterality Date   dental implant  2006   WISDOM TOOTH EXTRACTION  1999   Social History   Social History Narrative   Not on file   Immunization History  Administered Date(s) Administered   PFIZER(Purple Top)SARS-COV-2 Vaccination 02/18/2020, 03/21/2020, 10/18/2020     Objective: Vital Signs: BP 110/77 (BP Location: Left Arm, Patient Position: Sitting, Cuff Size: Small)   Pulse 84   Resp 12   Ht 5\' 6"  (1.676 m)   Wt 147 lb 3.2 oz (66.8 kg)   BMI 23.76 kg/m    Physical Exam Vitals and nursing note reviewed.  Constitutional:      Appearance:  She is well-developed.  HENT:     Head: Normocephalic and atraumatic.  Eyes:     Conjunctiva/sclera: Conjunctivae normal.  Pulmonary:     Effort: Pulmonary effort is normal.  Abdominal:     Palpations: Abdomen is soft.  Musculoskeletal:     Cervical back: Normal range of motion.  Skin:    General: Skin is warm and dry.     Capillary Refill: Capillary refill takes less than 2 seconds.  Neurological:     Mental Status: She is alert and oriented to person, place, and time.  Psychiatric:         Behavior: Behavior normal.     Musculoskeletal Exam: C-spine has good range of motion with no discomfort.  Shoulder joints have good range of motion with no discomfort.  Elbow joints have good range of motion without tenderness or inflammation.  Wrist joints have good range of motion with no tenderness or synovitis.  Tenderness of several MCP and PIP joints as described below.  Complete fist formation bilaterally.  Hip joints have good range of motion with some stiffness in the right hip.  Some pain and stiffness with range of motion of both knee joints.  Warmth of the right knee noted.  Ankle joints have good range of motion with no tenderness or joint swelling.  CDAI Exam: CDAI Score: 12.8  Patient Global: 4 mm; Provider Global: 4 mm Swollen: 1 ; Tender: 11  Joint Exam 09/18/2021      Right  Left  MCP 2   Tender     MCP 3   Tender   Tender  MCP 4      Tender  MCP 5   Tender     IP   Tender   Tender  PIP 2   Tender   Tender  PIP 3   Tender   Tender  Knee  Swollen         Investigation: No additional findings.  Imaging: No results found.  Recent Labs: Lab Results  Component Value Date   WBC 5.5 07/19/2021   HGB 15.1 07/19/2021   PLT 395 07/19/2021   NA 140 08/16/2021   K 5.3 08/16/2021   CL 104 08/16/2021   CO2 30 08/16/2021   GLUCOSE 88 08/16/2021   BUN 14 08/16/2021   CREATININE 0.79 08/16/2021   BILITOT 0.6 08/16/2021   AST 17 08/16/2021   ALT 11 08/16/2021   PROT 7.1 08/16/2021   CALCIUM 9.8 08/16/2021   GFRAA 119 04/20/2018   QFTBGOLDPLUS NEGATIVE 04/20/2018    Speciality Comments: No specialty comments available.  Procedures:  No procedures performed Allergies: Patient has no known allergies.   Assessment / Plan:     Visit Diagnoses: Inflammatory arthritis - Previously on SSZ. U/s of both hands negative for synovitis on 05/06/18. Hx of inflammatory arthritis: She presents today with increased pain in multiple joints especially both hands and both  knee joints.  She has noticed intermittent swelling in the left knee joint.  On examination today she has warmth of the right knee but no effusion noted.  She has tenderness over several MCP and PIP joints as described above.  She has been taking meloxicam 7.5 mg 1 tablet daily as needed for pain relief but developed gastritis and discontinued use about 2 weeks ago.  She was started on Pepcid twice daily by her PCP on Thursday which has started to improve her symptoms.  She was previously on sulfasalazine which she tolerated without any side  effects.  Sulfasalazine managed her inflammatory arthritis but was discontinued during her last pregnancy.  Indications, contraindications, potential side effects of sulfasalazine were discussed today in detail.  She would like to try a prescription for sulfasalazine prior to trying any other medications at this time.  Consent was updated today.  A prescription for sulfasalazine 500 mg 2 tablets twice daily was sent to the pharmacy today.  She was advised to notify us if she cannot tolerate taking sulfasalazine.  She will return for lab work in 1 month and an office visit in about 6 weeks to assess her response.  Medication counseling:  G6PD: 03/01/21  Chest x-ray: No active disease 04/14/08  Does the patient have an allergy to sulfa drugs? No  Patient was counseled on the purpose, proper use, and adverse effects of sulfasalazine including risk of infection and chance of nausea, headache, and sun sensitivity.  Also discussed risk of skin rash and advised patient to stop the medication and let us know if she develops a rash. Also discussed for the potential of discoloration of the urine, sweat, or tears.  Advised patient to avoid live vaccines.  Recommend annual influenza, Pneumovax 23, Prevnar 13, and Shingrix as indicated.   Reviewed the importance of frequent labs to monitor liver, kidneys, and blood counts.  Standing orders placed and patient to return 1 month after  starting therapy and then every 3 months.  Provided patient with educational materials on sulfasalazine and answered all questions.  Patient consented to sulfasalazine use, and consent will be uploaded into the media tab.    Patient dose will be 500 mg 2 tablets twice daily.   -High risk medication use: Sulfasalazine 500 mg 2 tablets by mouth twice daily.  Discontinued meloxicam due to developing gastritis.  CBC updated on 07/19/2021.  CMP within normal limits on 08/16/2021.  She will be due to update lab work in 1 month and every 3 months to monitor for drug toxicity.  Standing orders for CBC and CMP were placed today. She has not had any recent infections.  Discussed the importance of holding sulfasalazine if she develops signs or symptoms of an infection and to resume once infection is completely cleared.  Chronic right shoulder pain: She has good range of motion of the right shoulder joint with no discomfort at this time.  She has completed physical therapy which improved her symptoms.  Primary osteoarthritis of both hands: She continues to have pain and stiffness in both hands.  She has tenderness over several MCP and PIP joints as described above.  Discussed the importance of joint protection and muscle strengthening.  Chronic pain of left knee - X-rays of the left knee were obtained on 07/19/2021 which were unremarkable.  She has painful range of motion of the left knee joint on examination today with no warmth or effusion.  She experiences intermittent swelling in the left knee.  Pain in both feet: She is experiencing some increased pain in both feet since discontinuing meloxicam.  She had good range of motion of both ankle joints with no tenderness or inflammation on examination today.  Orders: Orders Placed This Encounter  Procedures   CBC with Differential/Platelet   COMPLETE METABOLIC PANEL WITH GFR    Meds ordered this encounter  Medications   sulfaSALAzine (AZULFIDINE) 500 MG tablet     Sig: Take 2 tablets (1,000 mg total) by mouth 2 (two) times daily.    Dispense:  180 tablet    Refill:  0  Follow-Up Instructions: Return in about 6 weeks (around 10/30/2021) for Inflammatory arthritis.   Gearldine Bienenstock, PA-C  Note - This record has been created using Dragon software.  Chart creation errors have been sought, but may not always  have been located. Such creation errors do not reflect on  the standard of medical care.

## 2021-09-18 ENCOUNTER — Ambulatory Visit: Payer: BC Managed Care – PPO | Admitting: Physician Assistant

## 2021-09-18 ENCOUNTER — Other Ambulatory Visit: Payer: Self-pay

## 2021-09-18 ENCOUNTER — Encounter: Payer: Self-pay | Admitting: Physician Assistant

## 2021-09-18 VITALS — BP 110/77 | HR 84 | Resp 12 | Ht 66.0 in | Wt 147.2 lb

## 2021-09-18 DIAGNOSIS — Z5181 Encounter for therapeutic drug level monitoring: Secondary | ICD-10-CM

## 2021-09-18 DIAGNOSIS — M25511 Pain in right shoulder: Secondary | ICD-10-CM

## 2021-09-18 DIAGNOSIS — M19042 Primary osteoarthritis, left hand: Secondary | ICD-10-CM

## 2021-09-18 DIAGNOSIS — M19041 Primary osteoarthritis, right hand: Secondary | ICD-10-CM | POA: Diagnosis not present

## 2021-09-18 DIAGNOSIS — M79672 Pain in left foot: Secondary | ICD-10-CM

## 2021-09-18 DIAGNOSIS — G8929 Other chronic pain: Secondary | ICD-10-CM

## 2021-09-18 DIAGNOSIS — M79671 Pain in right foot: Secondary | ICD-10-CM

## 2021-09-18 DIAGNOSIS — M1990 Unspecified osteoarthritis, unspecified site: Secondary | ICD-10-CM

## 2021-09-18 DIAGNOSIS — Z79899 Other long term (current) drug therapy: Secondary | ICD-10-CM | POA: Diagnosis not present

## 2021-09-18 DIAGNOSIS — M25562 Pain in left knee: Secondary | ICD-10-CM

## 2021-09-18 MED ORDER — SULFASALAZINE 500 MG PO TABS: 1000.0000 mg | ORAL_TABLET | Freq: Two times a day (BID) | ORAL | 0 refills | Status: DC

## 2021-09-18 NOTE — Progress Notes (Signed)
Pharmacy Note  Subjective:  Patient presents today to [facility] for follow up office visit.  Patient seen by pharmacist for counseling on sulfasalazine for inflammatory arthritis.  Prior therapy includes: SSZ (discontinued in past d/t family planning).  Objective: CMP     Component Value Date/Time   NA 140 08/16/2021 0841   K 5.3 08/16/2021 0841   CL 104 08/16/2021 0841   CO2 30 08/16/2021 0841   GLUCOSE 88 08/16/2021 0841   BUN 14 08/16/2021 0841   CREATININE 0.79 08/16/2021 0841   CALCIUM 9.8 08/16/2021 0841   PROT 7.1 08/16/2021 0841   AST 17 08/16/2021 0841   ALT 11 08/16/2021 0841   BILITOT 0.6 08/16/2021 0841   GFRNONAA 103 04/20/2018 0929   GFRAA 119 04/20/2018 0929    CBC    Component Value Date/Time   WBC 5.5 07/19/2021 0830   RBC 4.89 07/19/2021 0830   HGB 15.1 07/19/2021 0830   HCT 46.5 (H) 07/19/2021 0830   PLT 395 07/19/2021 0830   MCV 95.1 07/19/2021 0830   MCH 30.9 07/19/2021 0830   MCHC 32.5 07/19/2021 0830   RDW 12.2 07/19/2021 0830   LYMPHSABS 1,452 07/19/2021 0830   EOSABS 281 07/19/2021 0830   BASOSABS 39 07/19/2021 0830     Baseline Immunosuppressant Therapy Labs TB GOLD Quantiferon TB Gold Latest Ref Rng & Units 04/20/2018  Quantiferon TB Gold Plus NEGATIVE NEGATIVE     SPEP Serum Protein Electrophoresis Latest Ref Rng & Units 08/16/2021  Total Protein 6.1 - 8.1 g/dL 7.1   X5TZ - wnl on 0/01/74 (in [facility])  Chest x-ray: 04/14/2008 - no active cardiopulmonary disease (specifically no changes suggesting sarcoidosis or tuberculosis)  Does the patient have an allergy to sulfa drugs? No  Assessment/Plan: Patient was counseled on the purpose, proper use, and adverse effects of sulfasalazine including risk of infection and chance of nausea, headache, and sun sensitivity.  Also discussed risk of skin rash and advised patient to stop the medication and let us know if she develops a rash. Also discussed for the potential of discoloration  of the urine, sweat, or tears.  Advised patient to avoid live vaccines.  Recommend annual influenza, Pneumovax 23, Prevnar 13, and Shingrix as indicated.   Reviewed the importance of frequent labs to monitor liver, kidneys, and blood counts.  Standing orders placed and patient to return 1 month after starting therapy and then every 3 months.  Provided patient with educational materials on sulfasalazine and answered all questions.  Patient consented to sulfasalazine use, and consent will be uploaded into the media tab.    Patient dose will be 1000mg  twice daily.  Prescription sent to pharmacy. CBC and CMP utd.  , PharmD, MPH, BCPS Clinical Pharmacist (Rheumatology and Pulmonology)

## 2021-09-18 NOTE — Patient Instructions (Signed)
Standing Labs We placed an order today for your standing lab work.   Please have your standing labs drawn in 1 month then every 3 months   If possible, please have your labs drawn 2 weeks prior to your appointment so that the provider can discuss your results at your appointment.  Please note that you may see your imaging and lab results in MyChart before we have reviewed them. We may be awaiting multiple results to interpret others before contacting you. Please allow our office up to 72 hours to thoroughly review all of the results before contacting the office for clarification of your results.  We have open lab daily: Monday through Thursday from 1:30-4:30 PM and Friday from 1:30-4:00 PM at the office of Dr. Shaili Deveshwar, [facility].   Please be advised, all patients with office appointments requiring lab work will take precedent over walk-in lab work.  If possible, please come for your lab work on Monday and Friday afternoons, as you may experience shorter wait times. The office is located at [address], [address], [place], [place] [address] No appointment is necessary.   Labs are drawn by [facility]. Please bring your co-pay at the time of your lab draw.  You may receive a bill from [facility] for your lab work.  If you wish to have your labs drawn at another location, please call the office 24 hours in advance to send orders.  If you have any questions regarding directions or hours of operation,  please call [phone].   As a reminder, please drink plenty of water prior to coming for your lab work. Thanks!   Sulfasalazine tablets What is this medication? SULFASALAZINE (sul fa SAL a zeen) is used to treat ulcerative colitis. This medicine may be used for other purposes; ask your health care provider orpharmacist if you have questions. COMMON BRAND NAME(S): Azulfidine, Sulfazine What should I tell my care team before I take this medication? They need to know if you  have any of these conditions: asthma blood disorders or anemia glucose-6-phosphate dehydrogenase (G6PD) deficiency intestinal obstruction kidney disease liver disease porphyria urinary tract obstruction an unusual reaction to sulfasalazine, sulfa drugs, salicylates, or other medicines, foods, dyes, or preservatives pregnant or trying to get pregnant breast-feeding How should I use this medication? Take this medicine by mouth with a full glass of water. Follow the directions on the prescription label. If the medicine upsets your stomach, take it with food or milk. Take your medicine at regular intervals. Do not take your medicine more often than directed. Do not stop taking except on your doctor'sadvice. Talk to your pediatrician regarding the use of this medicine in children. While this drug may be prescribed for children as young as 6 years for selectedconditions, precautions do apply. Patients over 65 years old may have a stronger reaction and need a smaller dose. Overdosage: If you think you have taken too much of this medicine contact apoison control center or emergency room at once. NOTE: This medicine is only for you. Do not share this medicine with others. What if I miss a dose? If you miss a dose, take it as soon as you can. If it is almost time for yournext dose, take only that dose. Do not take double or extra doses. What may interact with this medication? digoxin folic acid This list may not describe all possible interactions. Give your health care provider a list of all the medicines, herbs, non-prescription drugs, or dietary supplements you use.   Also tell them if you smoke, drink alcohol, or use illegaldrugs. Some items may interact with your medicine. What should I watch for while using this medication? Visit your doctor or health care professional for regular checks on yourprogress. You will need frequent blood and urine checks. This medicine can make you more sensitive to  the sun. Keep out of the sun. If you cannot avoid being in the sun, wear protective clothing and use sunscreen.Do not use sun lamps or tanning beds/booths. Drink plenty of water while taking this medicine. What side effects may I notice from receiving this medication? Side effects that you should report to your doctor or health care professionalas soon as possible: allergic reactions like skin rash, itching or hives, swelling of the face, lips, or tongue fever, chills, or any other sign of infection painful, difficult, or reduced urination redness, blistering, peeling or loosening of the skin, including inside the mouth severe stomach pain unusual bleeding or bruising unusually weak or tired yellowing of the skin or eyes Side effects that usually do not require medical attention (report to yourdoctor or health care professional if they continue or are bothersome): headache loss of appetite nausea, vomiting orange color to the urine reduced sperm count This list may not describe all possible side effects. Call your doctor for medical advice about side effects. You may report side effects to FDA at1-800-FDA-1088. Where should I keep my medication? Keep out of the reach of children. Store at room temperature between 15 and 30 degrees C (59 and 86 degrees F). Keep container tightly closed. Throw away any unused medicine after theexpiration date. NOTE: This sheet is a summary. It may not cover all possible information. If you have questions about this medicine, talk to your doctor, pharmacist, orhealth care provider.  2022 Elsevier/Gold Standard (2008-08-03 11:38:15)  

## 2021-10-10 ENCOUNTER — Other Ambulatory Visit: Payer: Self-pay | Admitting: Family Medicine

## 2021-10-10 DIAGNOSIS — Z1231 Encounter for screening mammogram for malignant neoplasm of breast: Secondary | ICD-10-CM

## 2021-10-11 DIAGNOSIS — L509 Urticaria, unspecified: Secondary | ICD-10-CM | POA: Diagnosis not present

## 2021-10-16 NOTE — Progress Notes (Deleted)
   Office Visit Note  Patient: Leah Ball             Date of Birth: Jul 08, 1981           MRN: [id number]             PCP: Jarrett Soho, PA-C Referring: Jarrett Soho, PA-C Visit Date: 10/30/2021 Occupation: @GUAROCC @  Subjective:  No chief complaint on file.   History of Present Illness: [name] is a 40 y.o. female ***   Activities of Daily Living:  Patient reports morning stiffness for *** {minute/hour:19697}.   Patient {ACTIONS;DENIES/REPORTS:21021675::"Denies"} nocturnal pain.  Difficulty dressing/grooming: {ACTIONS;DENIES/REPORTS:21021675::"Denies"} Difficulty climbing stairs: {ACTIONS;DENIES/REPORTS:21021675::"Denies"} Difficulty getting out of chair: {ACTIONS;DENIES/REPORTS:21021675::"Denies"} Difficulty using hands for taps, buttons, cutlery, and/or writing: {ACTIONS;DENIES/REPORTS:21021675::"Denies"}  No Rheumatology ROS completed.   PMFS History:  There are no problems to display for this patient.   Past Medical History:  Diagnosis Date  . Rheumatoid arthritis (HCC)   . Rosacea     Family History  Problem Relation Age of Onset  . Heart attack Father   . High Cholesterol Father   . High Cholesterol Sister   . Healthy Son    Past Surgical History:  Procedure Laterality Date  . dental implant  2006  . WISDOM TOOTH EXTRACTION  1999   Social History   Social History Narrative  . Not on file   Immunization History  Administered Date(s) Administered  . PFIZER(Purple Top)SARS-COV-2 Vaccination 02/18/2020, 03/21/2020, 10/18/2020     Objective: Vital Signs: There were no vitals taken for this visit.   Physical Exam   Musculoskeletal Exam: ***  CDAI Exam: CDAI Score: -- Patient Global: --; Provider Global: -- Swollen: --; Tender: -- Joint Exam 10/30/2021   No joint exam has been documented for this visit   There is currently no information documented on the homunculus. Go to the Rheumatology activity and complete the homunculus  joint exam.  Investigation: No additional findings.  Imaging: No results found.  Recent Labs: Lab Results  Component Value Date   WBC 5.5 07/19/2021   HGB 15.1 07/19/2021   PLT 395 07/19/2021   NA 140 08/16/2021   K 5.3 08/16/2021   CL 104 08/16/2021   CO2 30 08/16/2021   GLUCOSE 88 08/16/2021   BUN 14 08/16/2021   CREATININE 0.79 08/16/2021   BILITOT 0.6 08/16/2021   AST 17 08/16/2021   ALT 11 08/16/2021   PROT 7.1 08/16/2021   CALCIUM 9.8 08/16/2021   GFRAA 119 04/20/2018   QFTBGOLDPLUS NEGATIVE 04/20/2018    Speciality Comments: No specialty comments available.  Procedures:  No procedures performed Allergies: Patient has no known allergies.   Assessment / Plan:     Visit Diagnoses: Inflammatory arthritis  High risk medication use  Chronic right shoulder pain  Primary osteoarthritis of both hands  Chronic pain of left knee  Orders: No orders of the defined types were placed in this encounter.  No orders of the defined types were placed in this encounter.   Face-to-face time spent with patient was *** minutes. Greater than 50% of time was spent in counseling and coordination of care.  Follow-Up Instructions: No follow-ups on file.   06/20/2018, PA-C  Note - This record has been created using Dragon software.  Chart creation errors have been sought, but may not always  have been located. Such creation errors do not reflect on  the standard of medical care.

## 2021-10-17 NOTE — Telephone Encounter (Signed)
Ok to schedule an appointment once the rash has totally resolved to discuss other treatment options.

## 2021-10-30 ENCOUNTER — Ambulatory Visit: Payer: BC Managed Care – PPO | Admitting: Physician Assistant

## 2021-10-30 DIAGNOSIS — Z79899 Other long term (current) drug therapy: Secondary | ICD-10-CM

## 2021-10-30 DIAGNOSIS — M1990 Unspecified osteoarthritis, unspecified site: Secondary | ICD-10-CM

## 2021-10-30 DIAGNOSIS — G8929 Other chronic pain: Secondary | ICD-10-CM

## 2021-10-30 DIAGNOSIS — M19041 Primary osteoarthritis, right hand: Secondary | ICD-10-CM

## 2021-11-13 ENCOUNTER — Ambulatory Visit
Admission: RE | Admit: 2021-11-13 | Discharge: 2021-11-13 | Disposition: A | Discharge status: Home / Self Care | Payer: BC Managed Care – PPO | Source: Ambulatory Visit | Attending: Family Medicine | Admitting: Family Medicine

## 2021-11-13 DIAGNOSIS — Z1231 Encounter for screening mammogram for malignant neoplasm of breast: Secondary | ICD-10-CM

## 2022-01-04 DIAGNOSIS — L718 Other rosacea: Secondary | ICD-10-CM | POA: Diagnosis not present

## 2022-01-04 NOTE — Progress Notes (Deleted)
° °  Office Visit Note  Patient: Leah Ball             Date of Birth: [birth year]           MRN: [id number]             PCP: Marda Stalker, PA-C Referring: Marda Stalker, PA-C Visit Date: 01/18/2022 Occupation: @GUAROCC @  Subjective:  No chief complaint on file.   History of Present Illness: Leah Ball is a 41 y.o. female ***   Activities of Daily Living:  Patient reports morning stiffness for *** {minute/hour:19697}.   Patient {ACTIONS;DENIES/REPORTS:21021675::"Denies"} nocturnal pain.  Difficulty dressing/grooming: {ACTIONS;DENIES/REPORTS:21021675::"Denies"} Difficulty climbing stairs: {ACTIONS;DENIES/REPORTS:21021675::"Denies"} Difficulty getting out of chair: {ACTIONS;DENIES/REPORTS:21021675::"Denies"} Difficulty using hands for taps, buttons, cutlery, and/or writing: {ACTIONS;DENIES/REPORTS:21021675::"Denies"}  No Rheumatology ROS completed.   PMFS History:  There are no problems to display for this patient.   Past Medical History:  Diagnosis Date   Rheumatoid arthritis ([place])    Rosacea     Family History  Problem Relation Age of Onset   Heart attack Father    High Cholesterol Father    High Cholesterol Sister    Breast cancer Paternal Aunt 57   Breast cancer Paternal Grandmother 66   Healthy Son    Past Surgical History:  Procedure Laterality Date   dental implant  2006   [place] EXTRACTION  1999   Social History   Social History Narrative   Not on file   Immunization History  Administered Date(s) Administered   PFIZER(Purple Top)SARS-COV-2 Vaccination 02/18/2020, 03/21/2020, 10/18/2020     Objective: Vital Signs: There were no vitals taken for this visit.   Physical Exam   Musculoskeletal Exam: ***  CDAI Exam: CDAI Score: -- Patient Global: --; Provider Global: -- Swollen: --; Tender: -- Joint Exam 01/18/2022   No joint exam has been documented for this visit   There is currently no information documented on the  homunculus. Go to the Rheumatology activity and complete the homunculus joint exam.  Investigation: No additional findings.  Imaging: No results found.  Recent Labs: Lab Results  Component Value Date   WBC 5.5 07/19/2021   HGB 15.1 07/19/2021   PLT 395 07/19/2021   NA 140 08/16/2021   K 5.3 08/16/2021   CL 104 08/16/2021   CO2 30 08/16/2021   GLUCOSE 88 08/16/2021   BUN 14 08/16/2021   CREATININE 0.79 08/16/2021   BILITOT 0.6 08/16/2021   AST 17 08/16/2021   ALT 11 08/16/2021   PROT 7.1 08/16/2021   CALCIUM 9.8 08/16/2021   GFRAA 119 04/20/2018   QFTBGOLDPLUS NEGATIVE 04/20/2018    Speciality Comments: No specialty comments available.  Procedures:  No procedures performed Allergies: Patient has no known allergies.   Assessment / Plan:     Visit Diagnoses: No diagnosis found.  Orders: No orders of the defined types were placed in this encounter.  No orders of the defined types were placed in this encounter.   Face-to-face time spent with patient was *** minutes. Greater than 50% of time was spent in counseling and coordination of care.  Follow-Up Instructions: No follow-ups on file.   Earnestine Mealing, CMA  Note - This record has been created using Editor, commissioning.  Chart creation errors have been sought, but may not always  have been located. Such creation errors do not reflect on  the standard of medical care.

## 2022-01-18 ENCOUNTER — Ambulatory Visit: Payer: Self-pay | Admitting: Rheumatology

## 2022-01-18 DIAGNOSIS — M19041 Primary osteoarthritis, right hand: Secondary | ICD-10-CM

## 2022-01-18 DIAGNOSIS — Z79899 Other long term (current) drug therapy: Secondary | ICD-10-CM

## 2022-01-18 DIAGNOSIS — M79671 Pain in right foot: Secondary | ICD-10-CM

## 2022-01-18 DIAGNOSIS — G8929 Other chronic pain: Secondary | ICD-10-CM

## 2022-01-18 DIAGNOSIS — M1990 Unspecified osteoarthritis, unspecified site: Secondary | ICD-10-CM

## 2022-07-17 NOTE — Progress Notes (Signed)
Office Visit Note  Patient: Leah Ball             Date of Birth: July 17, 1981           MRN: [id number]             PCP: Jarrett Soho, PA-C Referring: Jarrett Soho, PA-C Visit Date: 07/23/2022 Occupation: @GUAROCC @  Subjective:  Bilateral knee pain and swelling   History of Present Illness: Leah Ball is a 41 y.o. female with history of inflammatory arthritis and osteoarthritis.  She is no longer taking sulfasalazine or meloxicam.  She was last seen in the office on 09/18/21.  Patient reports that she has been experiencing pain and swelling in both knee joints.  She denies any injury prior to the onset of symptoms.  She denies any mechanical symptoms.  She is been having difficulty climbing steps as well as getting in and out of bed due to severity of pain and swelling in her knees.  She has been experiencing nocturnal pain despite taking Advil liquid gels for pain relief.  She is also had some increased discomfort in her jaw bilaterally.  She denies any other joint pain or joint swelling at this time.  She denies any Achilles tendinitis or planter fasciitis.  She denies any SI joint discomfort.  She denies any new rashes or psoriasis.  She is under the care of [facility] dermatology for management of rosacea. She has not planning pregnancy in the future and currently has a Mirena in place.  She denies any new medical conditions. He denies any recent or recurrent infections.    Activities of Daily Living:  Patient reports morning stiffness for all day. Patient Reports nocturnal pain.  Difficulty dressing/grooming: Reports Difficulty climbing stairs: Reports Difficulty getting out of chair: Reports Difficulty using hands for taps, buttons, cutlery, and/or writing: Denies  Review of Systems  Constitutional:  Positive for fatigue.  HENT:  Positive for mouth dryness. Negative for mouth sores and nose dryness.   Eyes:  Positive for dryness. Negative for pain and visual  disturbance.  Respiratory:  Negative for cough, hemoptysis and difficulty breathing.   Cardiovascular:  Negative for chest pain, palpitations, hypertension and swelling in legs/feet.  Gastrointestinal:  Negative for blood in stool, constipation and diarrhea.  Endocrine: Negative for increased urination.  Genitourinary:  Negative for painful urination and involuntary urination.  Musculoskeletal:  Positive for joint pain, joint pain, joint swelling, myalgias, muscle weakness, morning stiffness, muscle tenderness and myalgias.  Skin:  Positive for sensitivity to sunlight. Negative for color change, pallor, rash, hair loss, nodules/bumps, skin tightness and ulcers.  Allergic/Immunologic: Positive for susceptible to infections.  Neurological:  Positive for headaches. Negative for numbness and weakness.  Hematological:  Negative for swollen glands.  Psychiatric/Behavioral:  Positive for depressed mood and sleep disturbance. The patient is nervous/anxious.     PMFS History:  There are no problems to display for this patient.   Past Medical History:  Diagnosis Date  . Rheumatoid arthritis ([facility])   . Rosacea     Family History  Problem Relation Age of Onset  . Heart attack Father   . High Cholesterol Father   . High Cholesterol Sister   . Breast cancer Paternal Aunt 4  . Breast cancer Paternal Grandmother 21  . Healthy Son    Past Surgical History:  Procedure Laterality Date  . dental implant  2006  . [place] TO[place] EXTRACTION  1999   Social History   Social History Narrative  .  Not on file   Immunization History  Administered Date(s) Administered  . PFIZER(Purple Top)SARS-COV-2 Vaccination 02/18/2020, 03/21/2020, 10/18/2020     Objective: Vital Signs: BP 105/72 (BP Location: Left Arm, Patient Position: Sitting, Cuff Size: Normal)   Pulse 88   Resp 12   Ht 5\' 6"  (1.676 m)   Wt 157 lb 9.6 oz (71.5 kg)   BMI 25.44 kg/m    Physical Exam Vitals and nursing note reviewed.   Constitutional:      Appearance: She is well-developed.  HENT:     Head: Normocephalic and atraumatic.  Eyes:     Conjunctiva/sclera: Conjunctivae normal.  Cardiovascular:     Rate and Rhythm: Normal rate and regular rhythm.     Heart sounds: Normal heart sounds.  Pulmonary:     Effort: Pulmonary effort is normal.     Breath sounds: Normal breath sounds.  Abdominal:     General: Bowel sounds are normal.     Palpations: Abdomen is soft.  Musculoskeletal:     Cervical back: Normal range of motion.  Skin:    General: Skin is warm and dry.     Capillary Refill: Capillary refill takes less than 2 seconds.  Neurological:     Mental Status: She is alert and oriented to person, place, and time.  Psychiatric:        Behavior: Behavior normal.     Musculoskeletal Exam: C-spine, thoracic spine, and lumbar spine good ROM.  No midline spinal tenderness.  Shoulder joints, elbow joints, wrist joints, Mcps, PIPs, and DIPs good ROM with no synovitis.  Complete fist formation bilaterally.  Hip joints have good ROM.  Tenderness over the left trochanteric bursa.  Moderate effusion of both knees with warmth and pain with ROM.  Ankle joints have good ROM with no tenderness or synovitis.  No tenderness or synovitis of MTP joints.   CDAI Exam: CDAI Score: -- Patient Global: --; Provider Global: -- Swollen: 2 ; Tender: 4  Joint Exam 07/23/2022      Right  Left  TMJ   Tender   Tender  Knee  Swollen Tender  Swollen Tender     Investigation: No additional findings.  Imaging: XR KNEE 3 VIEW RIGHT  Result Date: 07/23/2022 Mild medial compartment narrowing was noted.  No patellofemoral narrowing was noted.  No chondrocalcinosis was noted. Impression: These findings are consistent with mild osteoarthritis of the knee.  XR KNEE 3 VIEW LEFT  Result Date: 07/23/2022 Mild medial compartment narrowing and mild patellofemoral narrowing was noted.  No chondrocalcinosis was noted. Impression: These  findings are consistent with mild osteoarthritis and mild chondromalacia patella of the knee.   Recent Labs: Lab Results  Component Value Date   WBC 5.5 07/19/2021   HGB 15.1 07/19/2021   PLT 395 07/19/2021   NA 140 08/16/2021   K 5.3 08/16/2021   CL 104 08/16/2021   CO2 30 08/16/2021   GLUCOSE 88 08/16/2021   BUN 14 08/16/2021   CREATININE 0.79 08/16/2021   BILITOT 0.6 08/16/2021   AST 17 08/16/2021   ALT 11 08/16/2021   PROT 7.1 08/16/2021   CALCIUM 9.8 08/16/2021   GFRAA 119 04/20/2018   QFTBGOLDPLUS NEGATIVE 04/20/2018    Speciality Comments: No specialty comments available.  Procedures:  Large Joint Inj: L knee on 07/23/2022 9:35 AM Indications: pain Details: 27 G 1.5 in needle, medial approach  Arthrogram: No  Medications: 3 mL lidocaine 1 %; 60 mg triamcinolone acetonide 40 MG/ML Aspirate: 28 mL clear  Outcome: tolerated well, no immediate complications Procedure, treatment alternatives, risks and benefits explained, specific risks discussed. Consent was given by the patient. Immediately prior to procedure a time out was called to verify the correct patient, procedure, equipment, support staff and site/side marked as required. Patient was prepped and draped in the usual sterile fashion.    Allergies: Patient has no known allergies.   Assessment / Plan:     Visit Diagnoses: Inflammatory arthritis - ANA-, RF-, anti-CCP negative, 14-3-3 eta negative on 07/19/21: Patient presents today with bilateral moderate knee joint effusions with warmth and painful range of motion.  She has not had any recent injury or fall prior to the onset of symptoms.  She is not currently taking any immunosuppressive agents.  She previously could not tolerate taking meloxicam or sulfasalazine.  She has been taking Advil on a daily basis for symptomatic relief but continues to experience nocturnal pain as well as difficulty finding steps.  On examination today she has moderate effusions in both knees,  left greater than right.  X-rays of both knees were obtained today which were consistent with mild osteoarthritis.  A left knee joint aspiration was attempted and 28 mL of clear yellow fluid was drawn off and sent for analysis.  The left knee joint was injected with cortisone as requested.  A prednisone taper starting at 20 mg tapering by 5 mg every 4 days was also sent to the pharmacy to treat her current flare.   Different treatment options were discussed today for management of inflammatory arthritis.  Indications, contraindications, and potential side effects of methotrexate were discussed today in detail.  All questions were addressed and consent was obtained today.  Baseline immunosuppressive labs were obtained today along with sed rate, CRP, RF, and anti-CCP for further evaluation.  A prescription for methotrexate will be sent to the pharmacy pending lab results.  She will start on methotrexate 6 tablets once weekly for 2 weeks and if labs are stable she will increase to 8 tablets once weekly.  She will take folic acid 2 mg daily.  Discussed the importance of avoiding Tylenol, alcohol, and NSAID use while taking methotrexate.  She will notify us if she cannot tolerate taking methotrexate.  She will follow-up in the office in 6 weeks or sooner if needed.  Plan: Sedimentation rate, C-reactive protein, Rheumatoid factor, Cyclic citrul peptide antibody, IgG  Drug Counseling TB Gold: Pending  Hepatitis panel: Pending   Chest-xray:  CXR 04/14/08 no active cardiopulmonary disease.   Contraception: Mirena in place.   Alcohol use: Discussed the importance of avoiding alcohol use.   Patient was counseled on the purpose, proper use, and adverse effects of methotrexate including nausea, infection, and signs and symptoms of pneumonitis.  Reviewed instructions with patient to take methotrexate weekly along with folic acid daily.  Discussed the importance of frequent monitoring of kidney and liver function and  blood counts, and provided patient with standing lab instructions.  Counseled patient to avoid NSAIDs and alcohol while on methotrexate.  Provided patient with educational materials on methotrexate and answered all questions.  Advised patient to get annual influenza vaccine and to get a pneumococcal vaccine if patient has not already had one.  Patient voiced understanding.  Patient consented to methotrexate use.  Will upload into chart.    High risk medication use -Plan to start the patient on methotrexate 6 tablets by mouth once weekly x2 weeks and then increase to 8 tablets once weekly if labs are stable.  She will take folic acid 2 mg daily.  She was advised to notify us if she cannot tolerate take oral methotrexate.   CBC drawn on 07/19/21.  CMP updated on 08/16/21.  Orders for CBC and CMP released today.  Her next lab work will be due in 2 weeks x 2, 2 months, then every 3 months.  Standing orders for CBC and CMP remain in place. The following baseline immunosuppressive labs will also be obtained today prior to initiating methotrexate. Previous therapy: meloxicam-gastritis, sulfasalazine-D/c.  She has not had any recent or recurrent infections.  Discussed the importance of holding methotrexate if she develops signs or symptoms of an infection and to resume once the infection has completely cleared.Plan: CBC with Differential/Platelet, COMPLETE METABOLIC PANEL WITH GFR, QuantiFERON-TB Gold Plus, IgG, IgA, IgM, Serum protein electrophoresis with reflex, Hepatitis B core antibody, IgM, Hepatitis B surface antigen, Hepatitis C antibody  Chronic right shoulder pain: Resolved.  She has good ROM of the right shoulder with no discomfort or tenderness.   Primary osteoarthritis of both hands: No tenderness or inflammation.  Complete fist formation bilaterally.    Pain in both feet: She has no tenderness or synovitis.    Chronic pain of both knees - She presents with pain in both knees left greater than right.   She has not had any recent injury or fall.  No mechanical symptoms at this time.  She has been experiencing significant pain, stiffness, and swelling in both knees over the past several months.  She has been taking Advil on a daily basis for symptomatic relief.  She is experiencing nocturnal pain as well as difficulty climbing steps as well as getting up from a seated position due to the pain and stiffness.  On examination she has moderate effusions and warmth in bilateral knees, left greater than right.  No erythema was noted.  X-rays of both knees were obtained today for further evaluation.  X-rays were consistent with mild osteoarthritic changes. Different treatment options were discussed today in detail.  She requested a left knee joint aspiration and cortisone injection today.  28 mL of fluid was drawn off the left knee and sent for analysis.  The left knee joint was injected with cortisone and the procedure note was completed above.  Aftercare was discussed.  We also discussed systemic treatment options for management of inflammatory arthritis.  Indications, contraindications, potential side effects of methotrexate were discussed today in detail.  She will be starting on methotrexate and folic acid as discussed above pending lab results today.  A prednisone taper starting at 20 mg tapering by 5 mg every 4 days was also sent to the pharmacy.  Plan: XR KNEE 3 VIEW RIGHT, XR KNEE 3 VIEW LEFT  Effusion, left knee: She presents today with a moderate effusion in the left knee joint.  28 mL of fluid was drawn off and sent for analysis.  The left knee joint was also injected with cortisone and the procedure note was completed above.  A prednisone taper starting at 20 mg tapering by 5 mg every 4 days was also sent to the pharmacy.  She will be initiating methotrexate and folic acid pending lab results today.  Effusion, right knee: Moderate right knee joint effusion.  The left knee joint was aspirated injected with  cortisone today.  She was advised to notify us of her right knee joint pain and swelling persists or worsens and she can return for a right knee joint aspiration and cortisone injection  in the future.  A prednisone taper starting at 20 mg tapering by 5 mg every 4 days was sent to the pharmacy today.  Instructions were provided including taking prednisone in the morning with food and to avoid the concurrent use of NSAIDs.  Orders: Orders Placed This Encounter  Procedures  . Large Joint Inj: L knee  . Anaerobic and Aerobic Culture  . XR KNEE 3 VIEW RIGHT  . XR KNEE 3 VIEW LEFT  . CBC with Differential/Platelet  . COMPLETE METABOLIC PANEL WITH GFR  . QuantiFERON-TB Gold Plus  . IgG, IgA, IgM  . Serum protein electrophoresis with reflex  . Hepatitis B core antibody, IgM  . Hepatitis B surface antigen  . Hepatitis C antibody  . Sedimentation rate  . C-reactive protein  . Rheumatoid factor  . Cyclic citrul peptide antibody, IgG  . Synovial Fluid Analysis, Complete   Meds ordered this encounter  Medications  . predniSONE (DELTASONE) 5 MG tablet    Sig: Take 4 tablets by mouth daily x4 days, 3 tablets daily x4 days, 2 tablets daily x4 days, 1 tablet daily x4 days.    Dispense:  40 tablet    Refill:  0     Follow-Up Instructions: Return in about 6 weeks (around 09/03/2022) for Inflammatory arthritis, OA.   Gearldine Bienenstockaylor M Corene Resnick, PA-C  Note - This record has been created using Dragon software.  Chart creation errors have been sought, but may not always  have been located. Such creation errors do not reflect on  the standard of medical care.

## 2022-07-23 ENCOUNTER — Ambulatory Visit: Payer: BC Managed Care – PPO | Attending: Physician Assistant | Admitting: Physician Assistant

## 2022-07-23 ENCOUNTER — Encounter: Payer: Self-pay | Admitting: Physician Assistant

## 2022-07-23 ENCOUNTER — Other Ambulatory Visit: Payer: Self-pay

## 2022-07-23 ENCOUNTER — Ambulatory Visit (INDEPENDENT_AMBULATORY_CARE_PROVIDER_SITE_OTHER): Payer: BC Managed Care – PPO

## 2022-07-23 VITALS — BP 105/72 | HR 88 | Resp 12 | Ht 66.0 in | Wt 157.6 lb

## 2022-07-23 DIAGNOSIS — M25511 Pain in right shoulder: Secondary | ICD-10-CM

## 2022-07-23 DIAGNOSIS — G8929 Other chronic pain: Secondary | ICD-10-CM

## 2022-07-23 DIAGNOSIS — M79671 Pain in right foot: Secondary | ICD-10-CM

## 2022-07-23 DIAGNOSIS — M1990 Unspecified osteoarthritis, unspecified site: Secondary | ICD-10-CM

## 2022-07-23 DIAGNOSIS — M25561 Pain in right knee: Secondary | ICD-10-CM | POA: Diagnosis not present

## 2022-07-23 DIAGNOSIS — M25562 Pain in left knee: Secondary | ICD-10-CM

## 2022-07-23 DIAGNOSIS — M19041 Primary osteoarthritis, right hand: Secondary | ICD-10-CM

## 2022-07-23 DIAGNOSIS — Z79899 Other long term (current) drug therapy: Secondary | ICD-10-CM

## 2022-07-23 DIAGNOSIS — M79672 Pain in left foot: Secondary | ICD-10-CM

## 2022-07-23 DIAGNOSIS — M25462 Effusion, left knee: Secondary | ICD-10-CM | POA: Diagnosis not present

## 2022-07-23 DIAGNOSIS — M25461 Effusion, right knee: Secondary | ICD-10-CM

## 2022-07-23 DIAGNOSIS — M19042 Primary osteoarthritis, left hand: Secondary | ICD-10-CM

## 2022-07-23 LAB — SYNOVIAL FLUID ANALYSIS, COMPLETE
Basophils, %: 0 %
Eosinophils-Synovial: 0 % (ref 0–2)
Lymphocytes-Synovial Fld: 16 % (ref 0–74)
Monocyte/Macrophage: 18 % (ref 0–69)
Neutrophil, Synovial: 66 % — ABNORMAL HIGH (ref 0–24)
Synoviocytes, %: 0 % (ref 0–15)
WBC, Synovial: 7440 cells/uL — ABNORMAL HIGH (ref <150)

## 2022-07-23 MED ORDER — TRIAMCINOLONE ACETONIDE 40 MG/ML IJ SUSP
60.0000 mg | INTRAMUSCULAR | Status: AC | PRN
  Administered 2022-07-23: 60 mg via INTRA_ARTICULAR

## 2022-07-23 MED ORDER — PREDNISONE 5 MG PO TABS: ORAL_TABLET | ORAL | 0 refills | Status: DC

## 2022-07-23 MED ORDER — LIDOCAINE HCL 1 % IJ SOLN
3.0000 mL | INTRAMUSCULAR | Status: AC | PRN
  Administered 2022-07-23: 3 mL

## 2022-07-23 NOTE — Progress Notes (Signed)
Synovial analysis is consistent with inflammatory process.  No crystals noted.

## 2022-07-23 NOTE — Progress Notes (Signed)
Platelet count is borderline elevated likely secondary to inflammation.  Rest of CBC WNL.

## 2022-07-23 NOTE — Telephone Encounter (Signed)
Pending lab results, patient will be starting methotrexate tablets and folic acid per Sherron Ales, PA-C.   Consent obtained and sent to the scan center. Thanks!

## 2022-07-23 NOTE — Patient Instructions (Addendum)
Standing Labs We placed an order today for your standing lab work.   Please have your standing labs drawn in 2 weeks x2, 2 months, and every 3 months   If possible, please have your labs drawn 2 weeks prior to your appointment so that the provider can discuss your results at your appointment.  Please note that you may see your imaging and lab results in [facility] before we have reviewed them. We may be awaiting multiple results to interpret others before contacting you. Please allow our office up to 72 hours to thoroughly review all of the results before contacting the office for clarification of your results.  We have open lab daily: Monday through Thursday from 1:30-4:30 PM and Friday from 1:30-4:00 PM at the office of Dr. Bo Merino, [facility].   Please be advised, all patients with office appointments requiring lab work will take precedent over walk-in lab work.  If possible, please come for your lab work on Monday and Friday afternoons, as you may experience shorter wait times. The office is located at [address] [place], [place],  [address] No appointment is necessary.   Labs are drawn by [facility]. Please bring your co-pay at the time of your lab draw.  You may receive a bill from [facility] for your lab work.  Please note if you are on Hydroxychloroquine and and an order has been placed for a Hydroxychloroquine level, you will need to have it drawn 4 hours or more after your last dose.  If you wish to have your labs drawn at another location, please call the office 24 hours in advance to send orders.  If you have any questions regarding directions or hours of operation,  please call [phone].   As a reminder, please drink plenty of water prior to coming for your lab work. Thanks!  If you have signs or symptoms of an infection or start antibiotics: First, call your PCP for workup of your infection. Hold your medication through the infection, until you  complete your antibiotics, and until symptoms resolve if you take the following: Injectable medication (Actemra, Benlysta, Cimzia, Cosentyx, Enbrel, Humira, Kevzara, Orencia, Remicade, Simponi, Stelara, Taltz, Tremfya) Methotrexate Leflunomide (Arava) Mycophenolate (Cellcept) Morrie Sheldon, Olumiant, or Rinvoq Vaccines You are taking a medication(s) that can suppress your immune system.  The following immunizations are recommended: Flu annually Covid-19  Td/Tdap (tetanus, diphtheria, pertussis) every 10 years Pneumonia (Prevnar 15 then Pneumovax 23 at least 1 year apart.  Alternatively, can take Prevnar 20 without needing additional dose) Shingrix: 2 doses from 4 weeks to 6 months apart  Please check with your PCP to make sure you are up to date.   Methotrexate Tablets What is this medication? METHOTREXATE (METH oh TREX ate) treats inflammatory conditions such as arthritis and psoriasis. It works by decreasing inflammation, which can reduce pain and prevent long-term injury to the joints and skin. It may also be used to treat some types of cancer. It works by slowing down the growth of cancer cells. This medicine may be used for other purposes; ask your health care provider or pharmacist if you have questions. COMMON BRAND NAME(S): Rheumatrex, Trexall What should I tell my care team before I take this medication? They need to know if you have any of these conditions: Fluid in the stomach area or lungs If you often drink alcohol Infection or immune system problems Kidney disease or on hemodialysis Liver disease Low blood counts, like low white cell, platelet, or red cell  counts Lung disease Radiation therapy Stomach ulcers Ulcerative colitis An unusual or allergic reaction to methotrexate, other medications, foods, dyes, or preservatives Pregnant or trying to get pregnant Breast-feeding How should I use this medication? Take this medication by mouth with a glass of water. Follow the  directions on the prescription label. Take your medication at regular intervals. Do not take it more often than directed. Do not stop taking except on your care team's advice. Make sure you know why you are taking this medication and how often you should take it. If this medication is used for a condition that is not cancer, like arthritis or psoriasis, it should be taken weekly, NOT daily. Taking this medication more often than directed can cause serious side effects, even death. Talk to your care team about safe handling and disposal of this medication. You may need to take special precautions. Talk to your care team about the use of this medication in children. While this medication may be prescribed for selected conditions, precautions do apply. Overdosage: If you think you have taken too much of this medicine contact a poison control center or emergency room at once. NOTE: This medicine is only for you. Do not share this medicine with others. What if I miss a dose? If you miss a dose, talk with your care team. Do not take double or extra doses. What may interact with this medication? Do not take this medication with any of the following: Acitretin This medication may also interact with the following: Aspirin and aspirin-like medications including salicylates Azathioprine Certain antibiotics like penicillins, tetracycline, and chloramphenicol Certain medications that treat or prevent blood clots like warfarin, apixaban, dabigatran, and rivaroxaban Certain medications for stomach problems like esomeprazole, omeprazole, pantoprazole Cyclosporine Dapsone Diuretics Gold Hydroxychloroquine Live virus vaccines Medications for infection like acyclovir, adefovir, amphotericin B, bacitracin, cidofovir, foscarnet, ganciclovir, gentamicin, pentamidine, vancomycin Mercaptopurine NSAIDs, medications for pain and inflammation, like ibuprofen or naproxen Other cytotoxic  agents Pamidronate Pemetrexed Penicillamine Phenylbutazone Phenytoin Probenecid Pyrimethamine Retinoids such as isotretinoin and tretinoin Steroid medications like prednisone or cortisone Sulfonamides like sulfasalazine and trimethoprim/sulfamethoxazole Theophylline Zoledronic acid This list may not describe all possible interactions. Give your health care provider a list of all the medicines, herbs, non-prescription drugs, or dietary supplements you use. Also tell them if you smoke, drink alcohol, or use illegal drugs. Some items may interact with your medicine. What should I watch for while using this medication? Avoid alcoholic drinks. This medication can make you more sensitive to the sun. Keep out of the sun. If you cannot avoid being in the sun, wear protective clothing and use sunscreen. Do not use sun lamps or tanning beds/booths. You may need blood work done while you are taking this medication. Call your care team for advice if you get a fever, chills or sore throat, or other symptoms of a cold or flu. Do not treat yourself. This medication decreases your body's ability to fight infections. Try to avoid being around people who are sick. This medication may increase your risk to bruise or bleed. Call your care team if you notice any unusual bleeding. Be careful brushing or flossing your teeth or using a toothpick because you may get an infection or bleed more easily. If you have any dental work done, tell your dentist you are receiving this medication. Check with your care team if you get an attack of severe diarrhea, nausea and vomiting, or if you sweat a lot. The loss of too much body fluid can  make it dangerous for you to take this medication. Talk to your care team about your risk of cancer. You may be more at risk for certain types of cancers if you take this medication. Do not become pregnant while taking this medication or for 6 months after stopping it. Women should inform  their care team if they wish to become pregnant or think they might be pregnant. Men should not father a child while taking this medication and for 3 months after stopping it. There is potential for serious harm to an unborn child. Talk to your care team for more information. Do not breast-feed an infant while taking this medication or for 1 week after stopping it. This medication may make it more difficult to get pregnant or father a child. Talk to your care team if you are concerned about your fertility. What side effects may I notice from receiving this medication? Side effects that you should report to your care team as soon as possible: Allergic reactions--skin rash, itching, hives, swelling of the face, lips, tongue, or throat Blood clot--pain, swelling, or warmth in the leg, shortness of breath, chest pain Dry cough, shortness of breath or trouble breathing Infection--fever, chills, cough, sore throat, wounds that don't heal, pain or trouble when passing urine, general feeling of discomfort or being unwell Kidney injury--decrease in the amount of urine, swelling of the ankles, hands, or feet Liver injury--right upper belly pain, loss of appetite, nausea, light-colored stool, dark yellow or brown urine, yellowing of the skin or eyes, unusual weakness or fatigue Low red blood cell count--unusual weakness or fatigue, dizziness, headache, trouble breathing Redness, blistering, peeling, or loosening of the skin, including inside the mouth Seizures Unusual bruising or bleeding Side effects that usually do not require medical attention (report to your care team if they continue or are bothersome): Diarrhea Dizziness Hair loss Nausea Pain, redness, or swelling with sores inside the mouth or throat Vomiting This list may not describe all possible side effects. Call your doctor for medical advice about side effects. You may report side effects to FDA at 1-800-FDA-1088. Where should I keep my  medication? Keep out of the reach of children and pets. Store at room temperature between 20 and 25 degrees C (68 and 77 degrees F). Protect from light. Get rid of any unused medication after the expiration date. Talk to your care team about how to dispose of unused medication. Special directions may apply. NOTE: This sheet is a summary. It may not cover all possible information. If you have questions about this medicine, talk to your doctor, pharmacist, or health care provider.  2023 Elsevier/Gold Standard (2008-01-23 00:00:00)

## 2022-07-24 NOTE — Progress Notes (Signed)
CMP WNL.  Immunoglobulins WNL.  ESR and CRP WNL.  Hepatitis B and C negative.   RF negative.

## 2022-07-26 LAB — CBC WITH DIFFERENTIAL/PLATELET
Absolute Monocytes: 312 cells/uL (ref 200–950)
Basophils Absolute: 60 cells/uL (ref 0–200)
Basophils Relative: 1 %
Eosinophils Absolute: 132 cells/uL (ref 15–500)
Eosinophils Relative: 2.2 %
HCT: 42 % (ref 35.0–45.0)
Hemoglobin: 14.3 g/dL (ref 11.7–15.5)
Lymphs Abs: 1200 cells/uL (ref 850–3900)
MCH: 32.4 pg (ref 27.0–33.0)
MCHC: 34 g/dL (ref 32.0–36.0)
MCV: 95 fL (ref 80.0–100.0)
MPV: 9.8 fL (ref 7.5–12.5)
Monocytes Relative: 5.2 %
Neutro Abs: 4296 cells/uL (ref 1500–7800)
Neutrophils Relative %: 71.6 %
Platelets: 430 10*3/uL — ABNORMAL HIGH (ref 140–400)
RBC: 4.42 10*6/uL (ref 3.80–5.10)
RDW: 12.3 % (ref 11.0–15.0)
Total Lymphocyte: 20 %
WBC: 6 10*3/uL (ref 3.8–10.8)

## 2022-07-26 LAB — PROTEIN ELECTROPHORESIS, SERUM, WITH REFLEX
Albumin ELP: 4.6 g/dL (ref 3.8–4.8)
Alpha 1: 0.3 g/dL (ref 0.2–0.3)
Alpha 2: 0.7 g/dL (ref 0.5–0.9)
Beta 2: 0.4 g/dL (ref 0.2–0.5)
Beta Globulin: 0.4 g/dL (ref 0.4–0.6)
Gamma Globulin: 1.3 g/dL (ref 0.8–1.7)
Total Protein: 7.7 g/dL (ref 6.1–8.1)

## 2022-07-26 LAB — IGG, IGA, IGM
IgG (Immunoglobin G), Serum: 1565 mg/dL (ref 600–1640)
IgM, Serum: 160 mg/dL (ref 50–300)
Immunoglobulin A: 154 mg/dL (ref 47–310)

## 2022-07-26 LAB — HEPATITIS C ANTIBODY: Hepatitis C Ab: NONREACTIVE

## 2022-07-26 LAB — COMPLETE METABOLIC PANEL WITH GFR
AG Ratio: 1.4 (calc) (ref 1.0–2.5)
ALT: 13 U/L (ref 6–29)
AST: 21 U/L (ref 10–30)
Albumin: 4.5 g/dL (ref 3.6–5.1)
Alkaline phosphatase (APISO): 107 U/L (ref 31–125)
BUN: 11 mg/dL (ref 7–25)
CO2: 24 mmol/L (ref 20–32)
Calcium: 9.6 mg/dL (ref 8.6–10.2)
Chloride: 102 mmol/L (ref 98–110)
Creat: 0.72 mg/dL (ref 0.50–0.99)
Globulin: 3.3 g/dL (calc) (ref 1.9–3.7)
Glucose, Bld: 83 mg/dL (ref 65–99)
Potassium: 4.7 mmol/L (ref 3.5–5.3)
Sodium: 139 mmol/L (ref 135–146)
Total Bilirubin: 0.4 mg/dL (ref 0.2–1.2)
Total Protein: 7.8 g/dL (ref 6.1–8.1)
eGFR: 108 mL/min/{1.73_m2} (ref >=60)

## 2022-07-26 LAB — C-REACTIVE PROTEIN: CRP: 6.5 mg/L (ref <8.0)

## 2022-07-26 LAB — QUANTIFERON-TB GOLD PLUS
Mitogen-NIL: 1.78 IU/mL
NIL: 0.02 IU/mL
QuantiFERON-TB Gold Plus: NEGATIVE
TB1-NIL: 0 IU/mL
TB2-NIL: 0 IU/mL

## 2022-07-26 LAB — HEPATITIS B CORE ANTIBODY, IGM: Hep B C IgM: NONREACTIVE

## 2022-07-26 LAB — HEPATITIS B SURFACE ANTIGEN: Hepatitis B Surface Ag: NONREACTIVE

## 2022-07-26 LAB — SEDIMENTATION RATE: Sed Rate: 19 mm/h (ref 0–20)

## 2022-07-26 LAB — RHEUMATOID FACTOR: Rheumatoid fact SerPl-aCnc: <14 IU/mL (ref <14)

## 2022-07-26 LAB — CYCLIC CITRUL PEPTIDE ANTIBODY, IGG: Cyclic Citrullin Peptide Ab: <16 UNITS

## 2022-07-26 MED ORDER — METHOTREXATE 2.5 MG PO TABS: ORAL_TABLET | ORAL | 0 refills | Status: DC

## 2022-07-26 MED ORDER — FOLIC ACID 1 MG PO TABS: 2.0000 mg | ORAL_TABLET | Freq: Every day | ORAL | 3 refills | Status: DC

## 2022-07-26 NOTE — Progress Notes (Signed)
All the labs are available now.  It is okay to send the prescription for methotrexate.  TB Gold is negative.

## 2022-07-26 NOTE — Telephone Encounter (Signed)
methotrexate 6 tablets once weekly for 2 weeks and if labs are stable she will increase to 8 tablets once weekly.  She will take folic acid 2 mg daily.

## 2022-08-09 ENCOUNTER — Other Ambulatory Visit: Payer: Self-pay

## 2022-08-09 DIAGNOSIS — Z79899 Other long term (current) drug therapy: Secondary | ICD-10-CM | POA: Diagnosis not present

## 2022-08-10 LAB — COMPLETE METABOLIC PANEL WITH GFR
AG Ratio: 1.8 (calc) (ref 1.0–2.5)
ALT: 10 U/L (ref 6–29)
AST: 17 U/L (ref 10–30)
Albumin: 4.6 g/dL (ref 3.6–5.1)
Alkaline phosphatase (APISO): 71 U/L (ref 31–125)
BUN: 14 mg/dL (ref 7–25)
CO2: 27 mmol/L (ref 20–32)
Calcium: 9.4 mg/dL (ref 8.6–10.2)
Chloride: 102 mmol/L (ref 98–110)
Creat: 0.83 mg/dL (ref 0.50–0.99)
Globulin: 2.6 g/dL (calc) (ref 1.9–3.7)
Glucose, Bld: 82 mg/dL (ref 65–99)
Potassium: 4.7 mmol/L (ref 3.5–5.3)
Sodium: 139 mmol/L (ref 135–146)
Total Bilirubin: 0.6 mg/dL (ref 0.2–1.2)
Total Protein: 7.2 g/dL (ref 6.1–8.1)
eGFR: 91 mL/min/{1.73_m2} (ref >=60)

## 2022-08-10 LAB — CBC WITH DIFFERENTIAL/PLATELET
Absolute Monocytes: 436 cells/uL (ref 200–950)
Basophils Absolute: 50 cells/uL (ref 0–200)
Basophils Relative: 0.5 %
Eosinophils Absolute: 40 cells/uL (ref 15–500)
Eosinophils Relative: 0.4 %
HCT: 42.6 % (ref 35.0–45.0)
Hemoglobin: 14 g/dL (ref 11.7–15.5)
Lymphs Abs: 1822 cells/uL (ref 850–3900)
MCH: 31.7 pg (ref 27.0–33.0)
MCHC: 32.9 g/dL (ref 32.0–36.0)
MCV: 96.6 fL (ref 80.0–100.0)
MPV: 9.2 fL (ref 7.5–12.5)
Monocytes Relative: 4.4 %
Neutro Abs: 7554 cells/uL (ref 1500–7800)
Neutrophils Relative %: 76.3 %
Platelets: 383 10*3/uL (ref 140–400)
RBC: 4.41 10*6/uL (ref 3.80–5.10)
RDW: 12.3 % (ref 11.0–15.0)
Total Lymphocyte: 18.4 %
WBC: 9.9 10*3/uL (ref 3.8–10.8)

## 2022-08-12 NOTE — Progress Notes (Signed)
CBC and CMP WNL

## 2022-08-19 ENCOUNTER — Other Ambulatory Visit: Payer: Self-pay | Admitting: Rheumatology

## 2022-08-20 NOTE — Progress Notes (Unsigned)
Office Visit Note  Patient: Leah Ball             Date of Birth: Nov 22, 1981           MRN: [id number]             PCP: Jarrett Soho, PA-C Referring: Jarrett Soho, PA-C Visit Date: 09/02/2022 Occupation: @GUAROCC @  Subjective:  Medication monitoring  History of Present Illness: Leah Ball is a 41 y.o. female with history of inflammatory arthritis and osteoarthritis.  Patient is currently taking methotrexate 8 tablets by mouth once weekly and folic acid 2 mg daily.  She was started on methotrexate after her last office visit on 07/23/2022.  She has been tolerating methotrexate without any side effects and has not missed any doses thus far.  She has noticed about an 80% improvement in her joint pain and inflammation since initiating therapy.  She is no longer taking meloxicam.  She states that her left knee joint pain has improved significantly since having the aspiration performed on 07/23/2022.  She continues to have some residual discomfort in both hands and mild discomfort in both knees.  She denies any other joint pain or joint swelling at this time.  She is willing to give methotrexate more time and for 09/22/2022 to reassess for the full efficacy at her follow-up visit.   Activities of Daily Living:  Patient reports morning stiffness for 20 minutes.   Patient Reports nocturnal pain.  Difficulty dressing/grooming: Denies Difficulty climbing stairs: Denies Difficulty getting out of chair: Denies Difficulty using hands for taps, buttons, cutlery, and/or writing: Reports  Review of Systems  Constitutional:  Positive for fatigue.  HENT:  Positive for mouth dryness. Negative for mouth sores.   Eyes:  Positive for dryness.  Respiratory:  Negative for shortness of breath.   Cardiovascular:  Negative for chest pain and palpitations.  Gastrointestinal:  Negative for blood in stool, constipation and diarrhea.  Endocrine: Negative for increased urination.  Genitourinary:  Negative  for involuntary urination.  Musculoskeletal:  Positive for joint pain, joint pain, joint swelling and morning stiffness. Negative for gait problem, myalgias, muscle weakness, muscle tenderness and myalgias.  Skin:  Positive for sensitivity to sunlight. Negative for color change, rash and hair loss.  Allergic/Immunologic: Positive for susceptible to infections.  Neurological:  Negative for dizziness and headaches.  Hematological:  Negative for swollen glands.  Psychiatric/Behavioral:  Negative for depressed mood and sleep disturbance. The patient is not nervous/anxious.     PMFS History:  There are no problems to display for this patient.   Past Medical History:  Diagnosis Date   Rheumatoid arthritis (HCC)    Rosacea     Family History  Problem Relation Age of Onset   Heart attack Father    High Cholesterol Father    High Cholesterol Sister    Breast cancer Paternal Aunt 64   Breast cancer Paternal Grandmother 54   Healthy Son    Past Surgical History:  Procedure Laterality Date   dental implant  2006   WISDOM TOOTH EXTRACTION  1999   Social History   Social History Narrative   Not on file   Immunization History  Administered Date(s) Administered   PFIZER(Purple Top)SARS-COV-2 Vaccination 02/18/2020, 03/21/2020, 10/18/2020     Objective: Vital Signs: BP 125/86 (BP Location: Left Arm, Patient Position: Sitting, Cuff Size: Normal)   Pulse 74   Resp 14   Ht 5\' 6"  (1.676 m)   Wt 158 lb 6.4 oz (71.8  kg)   BMI 25.57 kg/m    Physical Exam Vitals and nursing note reviewed.  Constitutional:      Appearance: She is well-developed.  HENT:     Head: Normocephalic and atraumatic.  Eyes:     Conjunctiva/sclera: Conjunctivae normal.  Cardiovascular:     Rate and Rhythm: Normal rate and regular rhythm.     Heart sounds: Normal heart sounds.  Pulmonary:     Effort: Pulmonary effort is normal.     Breath sounds: Normal breath sounds.  Abdominal:     General: Bowel sounds  are normal.     Palpations: Abdomen is soft.  Musculoskeletal:     Cervical back: Normal range of motion.  Skin:    General: Skin is warm and dry.     Capillary Refill: Capillary refill takes less than 2 seconds.  Neurological:     Mental Status: She is alert and oriented to person, place, and time.  Psychiatric:        Behavior: Behavior normal.      Musculoskeletal Exam: C-spine, thoracic spine, lumbar spine have good range of motion.  Shoulder joints, elbow joints, wrist joints, MCPs, PIPs, DIPs have good range of motion with no synovitis.  Complete fist formation bilaterally.  Tenderness over the right second MCP and PIP joint in the left first PIP joint.  Hip joints have good range of motion with no groin pain.  Warmth in the right knee noted.  Left knee has no warmth or effusion.  Ankle joints have good range of motion with no tenderness or joint swelling.  CDAI Exam: CDAI Score: 4.4  Patient Global: 2 mm; Provider Global: 2 mm Swollen: 1 ; Tender: 3  Joint Exam 09/02/2022      Right  Left  MCP 2   Tender     IP      Tender  PIP 2   Tender     Knee  Swollen         Investigation: No additional findings.  Imaging: No results found.  Recent Labs: Lab Results  Component Value Date   WBC 6.7 08/23/2022   HGB 13.8 08/23/2022   PLT 342 08/23/2022   NA 139 08/23/2022   K 4.6 08/23/2022   CL 103 08/23/2022   CO2 27 08/23/2022   GLUCOSE 83 08/23/2022   BUN 11 08/23/2022   CREATININE 0.84 08/23/2022   BILITOT 0.4 08/23/2022   AST 16 08/23/2022   ALT 13 08/23/2022   PROT 7.6 08/23/2022   CALCIUM 9.7 08/23/2022   GFRAA 119 04/20/2018   QFTBGOLDPLUS NEGATIVE 07/23/2022    Speciality Comments: No specialty comments available.  Procedures:  No procedures performed Allergies: Patient has no known allergies.   Assessment / Plan:     Visit Diagnoses: Inflammatory arthritis - ANA-, RF-, anti-CCP negative, 14-3-3 eta negative on 07/19/21: Patient has ongoing warmth  and mild swelling in the right knee joint on exam.  The left knee joint effusion resolved after having a cortisone injection and aspiration 07/23/2022.  She continues to have some tenderness over the right second MCP and PIP joint in the left first PIP joint but no synovitis was noted.  She overall she has noticed an 80% improvement in her joint pain, stiffness, and swelling since initiating methotrexate after her last office visit on 07/23/2022.  She has been tolerating methotrexate without any side effects and has not missed any doses.  She has not had any recent or recurrent infections. She will remain  on methotrexate as monotherapy for now.  She will follow-up in the office in 2 to 3 months or sooner if needed.  High risk medication use - Methotrexate 8 tablets by mouth once weekly and folic acid 2mg  po qd. methotrexate started after office visit on 07/23/2022. Previous therapy: meloxicam-gastritis, sulfasalazine-D/c.  CBC and CMP were drawn on 08/23/2022.  Lab work was within normal limits.  Her next lab work will be due in 2 months and every 3 months to monitor for drug toxicity.  Standing orders for CBC and CMP remain in place. She has not had any recent or recurrent infections. Discussed the importance of holding methotrexate if she develops signs or symptoms of an infection and to resume once the infection has completely cleared. She is planning on getting the annual flu shot as well as the COVID booster.  She plans on holding methotrexate 1 week after receiving the COVID booster.  Chronic right shoulder pain: She has good range of motion of the right shoulder joint on examination today.  No discomfort or tenderness noted.  Primary osteoarthritis of both hands: She has persistent tenderness over the right second MCP and PIP joint in the left first PIP joint.  Complete fist formation noted.  No synovitis noted.  Discussed the importance of joint protection and muscle strengthening.  Pain in both feet:  She is not experiencing any discomfort in her feet at this time.  She has good range of motion of both ankle joints with no tenderness or synovitis.  Chronic pain of both knees: X-rays of both knees were consistent with mild osteoarthritic changes on 07/23/2022.  Overall her knee joint pain and stiffness have improved since starting on MTX.   Effusion, left knee - 28 mL of fluid was drawn off and sent for analysis on 07/23/2022.  The left knee joint effusion has resolved.  She continues to have some discomfort intermittently but overall her pain and stiffness has improved.  Effusion, right knee: At her last office visit on 07/23/2022 she had a moderate right knee joint effusion.  On exam today she has warmth and a small effusion in the right knee.  Overall her symptoms have improved since initiating methotrexate.  She will remain on methotrexate as prescribed.  She was advised to notify 09/22/2022 if her symptoms persist or worsen.  Orders: No orders of the defined types were placed in this encounter.  No orders of the defined types were placed in this encounter.   Follow-Up Instructions: Return in about 3 months (around 12/02/2022) for Inflammatory arthritis , Osteoarthritis.   12/04/2022, PA-C  Note - This record has been created using Dragon software.  Chart creation errors have been sought, but may not always  have been located. Such creation errors do not reflect on  the standard of medical care.

## 2022-08-20 NOTE — Telephone Encounter (Signed)
Next Visit: 09/02/2022  Last Visit: 07/23/2022  Last Fill: 07/26/2022  DX: Inflammatory arthritis   Current Dose per office note 07/23/2022: methotrexate 6 tablets once weekly for 2 weeks and if labs are stable she will increase to 8 tablets once weekly.   Labs: 08/09/2022 CBC and CMP WNL  Patient has increased to 8 tabs and will come to have lab work this week  Okay to refill MTX?

## 2022-08-23 ENCOUNTER — Other Ambulatory Visit: Payer: Self-pay

## 2022-08-23 DIAGNOSIS — Z79899 Other long term (current) drug therapy: Secondary | ICD-10-CM | POA: Diagnosis not present

## 2022-08-24 LAB — CBC WITH DIFFERENTIAL/PLATELET
Absolute Monocytes: 382 cells/uL (ref 200–950)
Basophils Absolute: 40 cells/uL (ref 0–200)
Basophils Relative: 0.6 %
Eosinophils Absolute: 34 cells/uL (ref 15–500)
Eosinophils Relative: 0.5 %
HCT: 40 % (ref 35.0–45.0)
Hemoglobin: 13.8 g/dL (ref 11.7–15.5)
Lymphs Abs: 1581 cells/uL (ref 850–3900)
MCH: 32.8 pg (ref 27.0–33.0)
MCHC: 34.5 g/dL (ref 32.0–36.0)
MCV: 95 fL (ref 80.0–100.0)
MPV: 9.4 fL (ref 7.5–12.5)
Monocytes Relative: 5.7 %
Neutro Abs: 4663 cells/uL (ref 1500–7800)
Neutrophils Relative %: 69.6 %
Platelets: 342 10*3/uL (ref 140–400)
RBC: 4.21 10*6/uL (ref 3.80–5.10)
RDW: 12.5 % (ref 11.0–15.0)
Total Lymphocyte: 23.6 %
WBC: 6.7 10*3/uL (ref 3.8–10.8)

## 2022-08-24 LAB — COMPLETE METABOLIC PANEL WITH GFR
AG Ratio: 1.6 (calc) (ref 1.0–2.5)
ALT: 13 U/L (ref 6–29)
AST: 16 U/L (ref 10–30)
Albumin: 4.7 g/dL (ref 3.6–5.1)
Alkaline phosphatase (APISO): 70 U/L (ref 31–125)
BUN: 11 mg/dL (ref 7–25)
CO2: 27 mmol/L (ref 20–32)
Calcium: 9.7 mg/dL (ref 8.6–10.2)
Chloride: 103 mmol/L (ref 98–110)
Creat: 0.84 mg/dL (ref 0.50–0.99)
Globulin: 2.9 g/dL (calc) (ref 1.9–3.7)
Glucose, Bld: 83 mg/dL (ref 65–99)
Potassium: 4.6 mmol/L (ref 3.5–5.3)
Sodium: 139 mmol/L (ref 135–146)
Total Bilirubin: 0.4 mg/dL (ref 0.2–1.2)
Total Protein: 7.6 g/dL (ref 6.1–8.1)
eGFR: 89 mL/min/{1.73_m2} (ref >=60)

## 2022-08-26 NOTE — Progress Notes (Signed)
CBC and CMP WNL

## 2022-09-02 ENCOUNTER — Encounter: Payer: Self-pay | Admitting: Physician Assistant

## 2022-09-02 ENCOUNTER — Ambulatory Visit: Payer: BC Managed Care – PPO | Attending: Physician Assistant | Admitting: Physician Assistant

## 2022-09-02 VITALS — BP 125/86 | HR 74 | Resp 14 | Ht 66.0 in | Wt 158.4 lb

## 2022-09-02 DIAGNOSIS — M25461 Effusion, right knee: Secondary | ICD-10-CM

## 2022-09-02 DIAGNOSIS — M25511 Pain in right shoulder: Secondary | ICD-10-CM

## 2022-09-02 DIAGNOSIS — M79672 Pain in left foot: Secondary | ICD-10-CM

## 2022-09-02 DIAGNOSIS — G8929 Other chronic pain: Secondary | ICD-10-CM

## 2022-09-02 DIAGNOSIS — M79671 Pain in right foot: Secondary | ICD-10-CM

## 2022-09-02 DIAGNOSIS — M19041 Primary osteoarthritis, right hand: Secondary | ICD-10-CM

## 2022-09-02 DIAGNOSIS — M1990 Unspecified osteoarthritis, unspecified site: Secondary | ICD-10-CM

## 2022-09-02 DIAGNOSIS — M25462 Effusion, left knee: Secondary | ICD-10-CM

## 2022-09-02 DIAGNOSIS — Z79899 Other long term (current) drug therapy: Secondary | ICD-10-CM | POA: Diagnosis not present

## 2022-09-02 DIAGNOSIS — M1380 Other specified arthritis, unspecified site: Secondary | ICD-10-CM

## 2022-09-02 DIAGNOSIS — M19042 Primary osteoarthritis, left hand: Secondary | ICD-10-CM

## 2022-09-02 DIAGNOSIS — M25561 Pain in right knee: Secondary | ICD-10-CM

## 2022-09-02 DIAGNOSIS — M25562 Pain in left knee: Secondary | ICD-10-CM

## 2022-09-02 NOTE — Patient Instructions (Signed)
Standing Labs We placed an order today for your standing lab work.   Please have your standing labs drawn in 2 months and then every 3 months   If possible, please have your labs drawn 2 weeks prior to your appointment so that the provider can discuss your results at your appointment.  Please note that you may see your imaging and lab results in [place] before we have reviewed them. We may be awaiting multiple results to interpret others before contacting you. Please allow our office up to 72 hours to thoroughly review all of the results before contacting the office for clarification of your results.  We currently have open lab daily: Monday through Thursday from 1:30 PM-4:30 PM and Friday from 1:30 PM- 4:00 PM If possible, please come for your lab work on Monday, Thursday or Friday afternoons, as you may experience shorter wait times.   Effective October 16, 2022 the new lab hours will change to: Monday through Thursday from 1:30 PM-5:00 PM and Friday from 8:30 AM-12:00 PM If possible, please come for your lab work on Monday and Thursday afternoons, as you may experience shorter wait times.  Please be advised, all patients with office appointments requiring lab work will take precedent over walk-in lab work.    The office is located at [address], [place], [place], [place] [address] No appointment is necessary.   Labs are drawn by [facility]. Please bring your co-pay at the time of your lab draw.  You may receive a bill from [facility] for your lab work.  Please note if you are on Hydroxychloroquine and and an order has been placed for a Hydroxychloroquine level, you will need to have it drawn 4 hours or more after your last dose.  If you wish to have your labs drawn at another location, please call the office 24 hours in advance to send orders.  If you have any questions regarding directions or hours of operation,  please call [phone].   As a reminder, please drink plenty of water  prior to coming for your lab work. Thanks!  If you have signs or symptoms of an infection or start antibiotics: First, call your PCP for workup of your infection. Hold your medication through the infection, until you complete your antibiotics, and until symptoms resolve if you take the following: Injectable medication (Actemra, Benlysta, Cimzia, Cosentyx, Enbrel, Humira, Kevzara, Orencia, Remicade, Simponi, Stelara, Taltz, Tremfya) Methotrexate Leflunomide (Arava) Mycophenolate (Cellcept) Morrie Sheldon, Olumiant, or Rinvoq  Vaccines You are taking a medication(s) that can suppress your immune system.  The following immunizations are recommended: Flu annually Covid-19  Td/Tdap (tetanus, diphtheria, pertussis) every 10 years Pneumonia (Prevnar 15 then Pneumovax 23 at least 1 year apart.  Alternatively, can take Prevnar 20 without needing additional dose) Shingrix: 2 doses from 4 weeks to 6 months apart  Please check with your PCP to make sure you are up to date.

## 2022-09-19 DIAGNOSIS — Z1159 Encounter for screening for other viral diseases: Secondary | ICD-10-CM | POA: Diagnosis not present

## 2022-09-19 DIAGNOSIS — E785 Hyperlipidemia, unspecified: Secondary | ICD-10-CM | POA: Diagnosis not present

## 2022-09-19 DIAGNOSIS — Z0000 Encounter for general adult medical examination without abnormal findings: Secondary | ICD-10-CM | POA: Diagnosis not present

## 2022-09-19 DIAGNOSIS — M064 Inflammatory polyarthropathy: Secondary | ICD-10-CM | POA: Diagnosis not present

## 2022-11-04 NOTE — Progress Notes (Signed)
Office Visit Note  Patient: Leah Ball             Date of Birth: [birth year]           MRN: [id number]             PCP: Jarrett Soho, PA-C Referring: Jarrett Soho, PA-C Visit Date: 11/18/2022 Occupation: @GUAROCC @  Subjective:  Left knee pain and swelling   History of Present Illness: Leah Ball is a 41 y.o. female with history of chronic inflammatory arthritis.  She is taking methotrexate 8 tablets by mouth once weekly and folic acid 2 mg daily.  She is tolerating methotrexate without any side effects.  She recently missed 1 dose of methotrexate due to receiving the annual flu shot and COVID-19 booster.  Otherwise she has not had any gaps in therapy.  She has not had any recent infections.  She has noticed about an 80% improvement since initiating methotrexate.  She continues to have some soreness in her hands especially first thing in the morning.  She also has noticed her left knee joint pain has gradually started to return.  She denies a left knee joint effusion currently but does experience stiffness intermittently.  She requested a left knee joint cortisone injection today.  She denies any other joint pain or joint swelling at this time.   Activities of Daily Living:  Patient reports morning stiffness for 45 minutes.   Patient Denies nocturnal pain.  Difficulty dressing/grooming: Denies Difficulty climbing stairs: Reports Difficulty getting out of chair: Reports Difficulty using hands for taps, buttons, cutlery, and/or writing: Reports  Review of Systems  Constitutional:  Positive for fatigue.  HENT:  Negative for mouth sores and mouth dryness.   Eyes:  Positive for dryness.  Respiratory:  Negative for shortness of breath.   Cardiovascular:  Negative for chest pain and palpitations.  Gastrointestinal:  Negative for blood in stool, constipation and diarrhea.  Endocrine: Negative for increased urination.  Genitourinary:  Negative for involuntary urination.   Musculoskeletal:  Positive for joint pain, joint pain, joint swelling, myalgias, morning stiffness and myalgias. Negative for gait problem, muscle weakness and muscle tenderness.  Skin:  Positive for sensitivity to sunlight. Negative for color change, rash and hair loss.  Allergic/Immunologic: Positive for susceptible to infections.  Neurological:  Positive for headaches. Negative for dizziness.  Hematological:  Negative for swollen glands.  Psychiatric/Behavioral:  Positive for sleep disturbance. Negative for depressed mood. The patient is nervous/anxious.     PMFS History:  There are no problems to display for this patient.   Past Medical History:  Diagnosis Date   Rheumatoid arthritis (HCC)    Rosacea     Family History  Problem Relation Age of Onset   Heart attack Father    High Cholesterol Father    High Cholesterol Sister    Breast cancer Paternal Aunt 17   Breast cancer Paternal Grandmother 11   Healthy Son    Past Surgical History:  Procedure Laterality Date   dental implant  2006   WISDOM TOOTH EXTRACTION  1999   Social History   Social History Narrative   Not on file   Immunization History  Administered Date(s) Administered   PFIZER(Purple Top)SARS-COV-2 Vaccination 02/18/2020, 03/21/2020, 10/18/2020     Objective: Vital Signs: BP 92/67 (BP Location: Left Arm, Patient Position: Sitting, Cuff Size: Normal)   Pulse 88   Resp 14   Ht 5\' 6"  (1.676 m)   Wt 165 lb (74.8 kg)  BMI 26.63 kg/m    Physical Exam Vitals and nursing note reviewed.  Constitutional:      Appearance: She is well-developed.  HENT:     Head: Normocephalic and atraumatic.  Eyes:     Conjunctiva/sclera: Conjunctivae normal.  Cardiovascular:     Rate and Rhythm: Normal rate and regular rhythm.     Heart sounds: Normal heart sounds.  Pulmonary:     Effort: Pulmonary effort is normal.     Breath sounds: Normal breath sounds.  Abdominal:     General: Bowel sounds are normal.      Palpations: Abdomen is soft.  Musculoskeletal:     Cervical back: Normal range of motion.  Skin:    General: Skin is warm and dry.     Capillary Refill: Capillary refill takes less than 2 seconds.  Neurological:     Mental Status: She is alert and oriented to person, place, and time.  Psychiatric:        Behavior: Behavior normal.      Musculoskeletal Exam: C-spine, thoracic spine, lumbar spine have good range of motion.  Shoulder joints, elbow joints, wrist joints, MCPs, PIPs, DIPs have good range of motion with no synovitis.  Tenderness over the right third and fifth MCP joints and bilateral second PIP joints.  Hip joints have good range of motion with no groin pain.  Painful range of motion of the left knee mild warmth of the right knee noted.  No knee joint effusions noted.  Ankle joints have good range of motion with no tenderness or synovitis.  No tenderness or synovitis over MTP joints.  CDAI Exam: CDAI Score: 6.6  Patient Global: 3 mm; Provider Global: 3 mm Swollen: 0 ; Tender: 6  Joint Exam 11/18/2022      Right  Left  MCP 3   Tender     MCP 5   Tender     PIP 2   Tender   Tender  Knee   Tender   Tender     Investigation: No additional findings.  Imaging: No results found.  Recent Labs: Lab Results  Component Value Date   WBC 6.7 08/23/2022   HGB 13.8 08/23/2022   PLT 342 08/23/2022   NA 139 08/23/2022   K 4.6 08/23/2022   CL 103 08/23/2022   CO2 27 08/23/2022   GLUCOSE 83 08/23/2022   BUN 11 08/23/2022   CREATININE 0.84 08/23/2022   BILITOT 0.4 08/23/2022   AST 16 08/23/2022   ALT 13 08/23/2022   PROT 7.6 08/23/2022   CALCIUM 9.7 08/23/2022   GFRAA 119 04/20/2018   QFTBGOLDPLUS NEGATIVE 07/23/2022    Speciality Comments: No specialty comments available.  Procedures:  Large Joint Inj: L knee on 11/18/2022 9:05 AM Indications: pain Details: 27 G 1.5 in needle, medial approach  Arthrogram: No  Medications: 1.5 mL lidocaine 1 %; 40 mg  triamcinolone acetonide 40 MG/ML Aspirate: 0 mL Outcome: tolerated well, no immediate complications Procedure, treatment alternatives, risks and benefits explained, specific risks discussed. Consent was given by the patient. Immediately prior to procedure a time out was called to verify the correct patient, procedure, equipment, support staff and site/side marked as required. Patient was prepped and draped in the usual sterile fashion.     Allergies: Patient has no known allergies.   Assessment / Plan:     Visit Diagnoses: Inflammatory arthritis - ANA-, RF-, anti-CCP negative, 14-3-3 eta negative on 07/19/21: Patient has noticed about an 80% improvement in her joint  pain and inflammation since initiating methotrexate in August 2023.  She has been taking methotrexate 8 tablets by mouth once weekly along with folic acid 2 mg daily.  She has been tolerating methotrexate without any side effects.  She recently postpone her methotrexate dose by 1 week after receiving the flu and COVID-19 booster.  She has not had any other gaps in therapy.  She presents today with mild recurrence of left knee joint pain.   No effusion was noted.  She requested a left knee joint cortisone injection today. She tolerated the procedure well.  Aftercare was discussed.  Procedure note was completed above.  She was advised to notify us if her symptoms persist or worsen.  She will remain on methotrexate as prescribed.  If she continues to have persistent knee joint pain we can discussed combination therapy at her follow-up visit.  She will follow-up in the office in 3 months or sooner if needed.  High risk medication use - Methotrexate 8 tablets by mouth once weekly and folic acid 2mg  po qd. methotrexate started after office visit on 07/23/2022. - Plan: CBC with Differential/Platelet, COMPLETE METABOLIC PANEL WITH GFR, CBC with Differential/Platelet, COMPLETE METABOLIC PANEL WITH GFR CBC and CMP WNL on 08/23/22.  Orders for CBC and CMP  released today.  Her next lab work will be due in March and every 3 months to monitor for drug toxicity.  Standing orders for CBC and CMP were placed today. She has not had any recent or recurrent infections.  Discussed the importance of holding MTX if she develops signs or symptoms of an infection to resume once the infection has completley cleared.  Patient received the annual flu shot and COVID-19 booster.  Chronic right shoulder pain: Resolved.  Good ROM with no discomfort.   Primary osteoarthritis of both hands: She has some tenderness over bilateral 2nd PIP joints.  No inflammation noted.  Complete fist formation bilaterally.   Pain in both feet: Improved.  Good range of motion of both ankle joints with no tenderness or synovitis.  Chronic pain of both knees - X-rays of both knees were consistent with mild osteoarthritic changes on 07/23/2022.  Effusion, left knee - 28 mL of fluid was drawn off and sent for analysis on 07/23/2022.  The left knee joint effusion has resolved.  She continues to have some increased discomfort in the left knee joint.  No recent injury or fall.  No mechanical symptoms.  Different treatment options were discussed.  She requested a left knee joint cortisone injection today.  Procedure note was completed above.  Aftercare was discussed. - Plan: Large Joint Inj: L knee  Effusion, right knee: Resolved. Mild warmth noted.   Chronic pain of left knee - Patient presents today with recurrence of left knee joint pain.  Overall her symptoms have been mild.  No effusion was noted on examination today.  Different treatment options were discussed.  She requested a left knee joint cortisone injection today.  She tolerated the procedure well.  Procedure note was completed above.  Aftercare was discussed.  She was advised to notify 09/22/2022 if her symptoms persist or worsen.  Plan: Large Joint Inj: L knee  Orders: Orders Placed This Encounter  Procedures   Large Joint Inj: L knee   CBC  with Differential/Platelet   COMPLETE METABOLIC PANEL WITH GFR   CBC with Differential/Platelet   COMPLETE METABOLIC PANEL WITH GFR   No orders of the defined types were placed in this encounter.  Follow-Up Instructions: Return in about 3 months (around 02/17/2023) for Inflammatory arthritis .   Gearldine Bienenstock, PA-C  Note - This record has been created using Dragon software.  Chart creation errors have been sought, but may not always  have been located. Such creation errors do not reflect on  the standard of medical care.

## 2022-11-05 DIAGNOSIS — H53143 Visual discomfort, bilateral: Secondary | ICD-10-CM | POA: Diagnosis not present

## 2022-11-12 ENCOUNTER — Other Ambulatory Visit: Payer: Self-pay | Admitting: Physician Assistant

## 2022-11-12 DIAGNOSIS — Z6826 Body mass index (BMI) 26.0-26.9, adult: Secondary | ICD-10-CM | POA: Diagnosis not present

## 2022-11-12 DIAGNOSIS — Z1231 Encounter for screening mammogram for malignant neoplasm of breast: Secondary | ICD-10-CM | POA: Diagnosis not present

## 2022-11-12 DIAGNOSIS — Z01419 Encounter for gynecological examination (general) (routine) without abnormal findings: Secondary | ICD-10-CM | POA: Diagnosis not present

## 2022-11-12 NOTE — Telephone Encounter (Signed)
Next Visit: 11/18/2022  Last Visit: 09/02/2022  Last Fill: 08/20/2022  DX: Inflammatory arthritis   Current Dose per office note 09/02/2022:  Methotrexate 8 tablets by mouth once weekly    Labs: 08/23/2022 CBC and CMP WNL   Okay to refill MTX?

## 2022-11-18 ENCOUNTER — Ambulatory Visit: Payer: BC Managed Care – PPO | Attending: Physician Assistant | Admitting: Physician Assistant

## 2022-11-18 ENCOUNTER — Encounter: Payer: Self-pay | Admitting: Physician Assistant

## 2022-11-18 VITALS — BP 92/67 | HR 88 | Resp 14 | Ht 66.0 in | Wt 165.0 lb

## 2022-11-18 DIAGNOSIS — M25511 Pain in right shoulder: Secondary | ICD-10-CM

## 2022-11-18 DIAGNOSIS — M1990 Unspecified osteoarthritis, unspecified site: Secondary | ICD-10-CM | POA: Diagnosis not present

## 2022-11-18 DIAGNOSIS — G8929 Other chronic pain: Secondary | ICD-10-CM | POA: Diagnosis not present

## 2022-11-18 DIAGNOSIS — M25462 Effusion, left knee: Secondary | ICD-10-CM | POA: Diagnosis not present

## 2022-11-18 DIAGNOSIS — M79672 Pain in left foot: Secondary | ICD-10-CM

## 2022-11-18 DIAGNOSIS — M79671 Pain in right foot: Secondary | ICD-10-CM

## 2022-11-18 DIAGNOSIS — M25562 Pain in left knee: Secondary | ICD-10-CM

## 2022-11-18 DIAGNOSIS — Z79899 Other long term (current) drug therapy: Secondary | ICD-10-CM

## 2022-11-18 DIAGNOSIS — M25461 Effusion, right knee: Secondary | ICD-10-CM

## 2022-11-18 DIAGNOSIS — M25561 Pain in right knee: Secondary | ICD-10-CM

## 2022-11-18 DIAGNOSIS — M19042 Primary osteoarthritis, left hand: Secondary | ICD-10-CM

## 2022-11-18 DIAGNOSIS — M19041 Primary osteoarthritis, right hand: Secondary | ICD-10-CM

## 2022-11-18 MED ORDER — LIDOCAINE HCL 1 % IJ SOLN
1.5000 mL | INTRAMUSCULAR | Status: AC | PRN
  Administered 2022-11-18: 1.5 mL

## 2022-11-18 MED ORDER — TRIAMCINOLONE ACETONIDE 40 MG/ML IJ SUSP
40.0000 mg | INTRAMUSCULAR | Status: AC | PRN
  Administered 2022-11-18: 40 mg via INTRA_ARTICULAR

## 2022-11-18 NOTE — Patient Instructions (Addendum)
Standing Labs We placed an order today for your standing lab work.   Please have your standing labs drawn in March and every 3 months  Please have your labs drawn 2 weeks prior to your appointment so that the provider can discuss your lab results at your appointment.  Please note that you may see your imaging and lab results in MyChart before we have reviewed them. We will contact you once all results are reviewed. Please allow our office up to 72 hours to thoroughly review all of the results before contacting the office for clarification of your results.  Lab hours are:   Monday through Thursday from 8:00 am -12:30 pm and 1:00 pm-5:00 pm and Friday from 8:00 am-12:00 pm.  Please be advised, all patients with office appointments requiring lab work will take precedent over walk-in lab work.   Labs are drawn by [facility]. Please bring your co-pay at the time of your lab draw.  You may receive a bill from [facility] for your lab work.  Please note if you are on Hydroxychloroquine and and an order has been placed for a Hydroxychloroquine level, you will need to have it drawn 4 hours or more after your last dose.  If you wish to have your labs drawn at another location, please call the office 24 hours in advance so we can fax the orders.  The office is located at [address], [address], [place], [place] [address] No appointment is necessary.    If you have any questions regarding directions or hours of operation,  please call [phone].   As a reminder, please drink plenty of water prior to coming for your lab work. Thanks!  

## 2022-11-19 LAB — CBC WITH DIFFERENTIAL/PLATELET
Absolute Monocytes: 461 cells/uL (ref 200–950)
Basophils Absolute: 80 cells/uL (ref 0–200)
Basophils Relative: 1.5 %
Eosinophils Absolute: 80 cells/uL (ref 15–500)
Eosinophils Relative: 1.5 %
HCT: 37.8 % (ref 35.0–45.0)
Hemoglobin: 12.5 g/dL (ref 11.7–15.5)
Lymphs Abs: 1208 cells/uL (ref 850–3900)
MCH: 32.1 pg (ref 27.0–33.0)
MCHC: 33.1 g/dL (ref 32.0–36.0)
MCV: 97.2 fL (ref 80.0–100.0)
MPV: 9.8 fL (ref 7.5–12.5)
Monocytes Relative: 8.7 %
Neutro Abs: 3472 cells/uL (ref 1500–7800)
Neutrophils Relative %: 65.5 %
Platelets: 340 10*3/uL (ref 140–400)
RBC: 3.89 10*6/uL (ref 3.80–5.10)
RDW: 13 % (ref 11.0–15.0)
Total Lymphocyte: 22.8 %
WBC: 5.3 10*3/uL (ref 3.8–10.8)

## 2022-11-19 LAB — COMPLETE METABOLIC PANEL WITH GFR
AG Ratio: 1.7 (calc) (ref 1.0–2.5)
ALT: 24 U/L (ref 6–29)
AST: 23 U/L (ref 10–30)
Albumin: 4.5 g/dL (ref 3.6–5.1)
Alkaline phosphatase (APISO): 73 U/L (ref 31–125)
BUN: 14 mg/dL (ref 7–25)
CO2: 30 mmol/L (ref 20–32)
Calcium: 9.6 mg/dL (ref 8.6–10.2)
Chloride: 104 mmol/L (ref 98–110)
Creat: 0.77 mg/dL (ref 0.50–0.99)
Globulin: 2.7 g/dL (calc) (ref 1.9–3.7)
Glucose, Bld: 69 mg/dL (ref 65–99)
Potassium: 4.9 mmol/L (ref 3.5–5.3)
Sodium: 141 mmol/L (ref 135–146)
Total Bilirubin: 0.5 mg/dL (ref 0.2–1.2)
Total Protein: 7.2 g/dL (ref 6.1–8.1)
eGFR: 99 mL/min/{1.73_m2} (ref >=60)

## 2022-11-19 NOTE — Progress Notes (Signed)
CBC and CMP WNL

## 2022-11-28 DIAGNOSIS — E785 Hyperlipidemia, unspecified: Secondary | ICD-10-CM | POA: Diagnosis not present

## 2023-02-06 NOTE — Progress Notes (Unsigned)
Office Visit Note  Patient: Leah Ball             Date of Birth: 1981-03-18           MRN: [id number]             PCP: Marda Stalker, PA-C Referring: Marda Stalker, PA-C Visit Date: 02/19/2023 Occupation: '@GUAROCC'$ @  Subjective:  Pain in both knees   History of Present Illness: Leah Ball is a 42 y.o. female with history of inflammatory arthritis and osteoarthritis.  She is taking methotrexate 8 tablets by mouth once weekly and folic acid 2 mg daily.  She continues to tolerate methotrexate without any side effects.  She denies missing any doses of methotrexate recently.  She has not had any recurrent infections.  She had a left knee joint cortisone injection performed at her last office visit on 11/18/2022 which righted significant relief but her symptoms have started to return.  Patient reports that for the past 3 weeks she has started to have increased pain and stiffness involving multiple joints.  She has noticed increased swelling and pain in both knee joints, left greater than right.  She denies any change in activity prior to the onset of the flare.  She states she is also been having increased stiffness in both hip joints.  She has had increased nocturnal pain in her knee joints.  She has not taken any over-the-counter products for pain relief.  She has not taken a prednisone taper recently.  Patient reports that she will be leaving for [place] in 2 weeks and is concerned about the amount of walking she will be doing.    Activities of Daily Living:  Patient reports morning stiffness for 45  minutes.   Patient Reports nocturnal pain.  Difficulty dressing/grooming: Reports Difficulty climbing stairs: Reports Difficulty getting out of chair: Denies Difficulty using hands for taps, buttons, cutlery, and/or writing: Reports  Review of Systems  Constitutional:  Positive for fatigue.  HENT:  Positive for mouth dryness. Negative for mouth sores.   Eyes:  Positive for  dryness.  Respiratory: Negative.  Negative for shortness of breath.   Cardiovascular: Negative.  Negative for chest pain and palpitations.  Gastrointestinal: Negative.  Negative for blood in stool, constipation and diarrhea.  Endocrine: Negative.  Negative for increased urination.  Genitourinary: Negative.  Negative for involuntary urination.  Musculoskeletal:  Positive for joint pain, gait problem, joint pain and morning stiffness. Negative for joint swelling, myalgias, muscle weakness, muscle tenderness and myalgias.  Skin:  Negative for color change, rash, hair loss and sensitivity to sunlight.  Allergic/Immunologic: Negative for susceptible to infections.  Neurological:  Negative for dizziness and headaches.  Hematological:  Negative for swollen glands.  Psychiatric/Behavioral:  Negative for depressed mood and sleep disturbance. The patient is not nervous/anxious.     PMFS History:  There are no problems to display for this patient.   Past Medical History:  Diagnosis Date   Rheumatoid arthritis ([place])    Rosacea     Family History  Problem Relation Age of Onset   Heart attack Father    High Cholesterol Father    High Cholesterol Sister    Breast cancer Paternal Aunt 91   Breast cancer Paternal Grandmother 39   Healthy Son    Past Surgical History:  Procedure Laterality Date   dental implant  2006   [place] EXTRACTION  1999   Social History   Social History Narrative   Not on file  Immunization History  Administered Date(s) Administered   PFIZER(Purple Top)SARS-COV-2 Vaccination 02/18/2020, 03/21/2020, 10/18/2020     Objective: Vital Signs: BP 102/72 (BP Location: Left Arm, Patient Position: Sitting, Cuff Size: Normal)   Pulse 80   Resp 16   Ht '5\' 6"'$  (1.676 m)   Wt 171 lb 3.2 oz (77.7 kg)   BMI 27.63 kg/m    Physical Exam Vitals and nursing note reviewed.  Constitutional:      Appearance: She is well-developed.  HENT:     Head: Normocephalic and  atraumatic.  Eyes:     Conjunctiva/sclera: Conjunctivae normal.  Cardiovascular:     Rate and Rhythm: Normal rate and regular rhythm.     Heart sounds: Normal heart sounds.  Pulmonary:     Effort: Pulmonary effort is normal.     Breath sounds: Normal breath sounds.  Abdominal:     General: Bowel sounds are normal.     Palpations: Abdomen is soft.  Musculoskeletal:     Cervical back: Normal range of motion.  Skin:    General: Skin is warm and dry.     Capillary Refill: Capillary refill takes less than 2 seconds.  Neurological:     Mental Status: She is alert and oriented to person, place, and time.  Psychiatric:        Behavior: Behavior normal.      Musculoskeletal Exam: C-spine, thoracic spine and lumbar spine have good range of motion.  Some discomfort with lumbar spine range of motion.  Tenderness over both SI joints.  Shoulder joints, elbow joints, wrist joints, MCPs, PIPs, DIPs have good range of motion with no synovitis.  Tenderness over several MCP and PIP joints as described below.  Complete fist formation noted bilaterally.  Stiffness with range of motion of both hip joints.  No tenderness over the trochanteric bursa bilaterally.  Painful range of motion of both knee joints, left greater than right.  Swelling of both knees noted.  Ankle joints have good range of motion with no tenderness or synovitis.  No tenderness or synovitis over MTP joints.  CDAI Exam: CDAI Score: 11.4  Patient Global: 7 mm; Provider Global: 7 mm Swollen: 2 ; Tender: 8  Joint Exam 02/19/2023      Right  Left  MCP 2   Tender   Tender  MCP 3   Tender     PIP 2   Tender   Tender  PIP 5      Tender  Knee  Swollen Tender  Swollen Tender     Investigation: No additional findings.  Imaging: No results found.  Recent Labs: Lab Results  Component Value Date   WBC 5.3 11/18/2022   HGB 12.5 11/18/2022   PLT 340 11/18/2022   NA 141 11/18/2022   K 4.9 11/18/2022   CL 104 11/18/2022   CO2 30  11/18/2022   GLUCOSE 69 11/18/2022   BUN 14 11/18/2022   CREATININE 0.77 11/18/2022   BILITOT 0.5 11/18/2022   AST 23 11/18/2022   ALT 24 11/18/2022   PROT 7.2 11/18/2022   CALCIUM 9.6 11/18/2022   GFRAA 119 04/20/2018   QFTBGOLDPLUS NEGATIVE 07/23/2022    Speciality Comments: No specialty comments available.  Procedures:  Large Joint Inj: L knee on 02/19/2023 9:36 AM Indications: pain Details: 27 G 1.5 in needle, medial approach  Arthrogram: No  Medications: 1.5 mL lidocaine 1 %; 40 mg triamcinolone acetonide 40 MG/ML Aspirate: 0 mL Outcome: tolerated well, no immediate complications Procedure, treatment alternatives,  risks and benefits explained, specific risks discussed. Consent was given by the patient. Immediately prior to procedure a time out was called to verify the correct patient, procedure, equipment, support staff and site/side marked as required. Patient was prepped and draped in the usual sterile fashion.     Allergies: Patient has no known allergies.    Assessment / Plan:     Visit Diagnoses:  Seronegative rheumatoid arthritis ([place]) - ANA-, RF-, anti-CCP negative, 14-3-3 eta negative on 07/19/21: Patient presents today experiencing a flare in both knees.  Her symptoms have progressively been worsening over the past 3 weeks.  She has not had any change in activity prior to the onset of symptoms.  She has not missed any doses of methotrexate.  She has been taking methotrexate 8 tablets by mouth once weekly and folic acid 2 mg daily.  Examination she has swelling in both knees and stiffness in both hips with range of motion.  She has been experiencing nocturnal pain in both knees.  She has not been taking any over-the-counter products.  She has not taken a prednisone taper recently.  She had a left knee joint cortisone injection performed on 11/18/2022 which provided significant relief but her symptoms have returned. Different treatment options were discussed today.  She is  apprehensive to make any drastic medication changes at this time since she will be leaving for [place] in 2 weeks.  Plan to switch from oral methotrexate to injectable methotrexate.  We will try applying for Rasuvo 20 mg subcutaneous injections once weekly.  She will remain on folic acid 2 mg daily.  Previous therapy includes Plaquenil and sulfasalazine.  She is nave to [facility].  She has had all baseline immunosuppressive labs.  If she continues to have recurrent flares we will discuss adding on a TNF inhibitor as combination therapy in the future. A prednisone taper starting at 20 mg tapering by 5 mg every 4 days was sent to the pharmacy.  Instructions were provided.  She was encouraged to avoid the use of NSAIDs while taking prednisone.  A left knee joint cortisone injection was also performed today.  She tolerated procedure well and the procedure note was completed above. She will follow-up in the office in 3 months or sooner if needed.  High risk medication use - She will be switching from oral to injectable methotrexate.   Plan to apply for Rasuvo 20 mg sq injections once weekly.  She will remain on folic acid 2 mg daily.  Oral Methotrexate was started in August 2023.  Previous therapy: sulfasalazine and plaquenil.  Naive to biologics.  CBC and CMP WNL on 11/18/22. Orders for CBC and CMP released today.  Her next lab work will be due in June and every 3 months.  Standing orders for CBC and CMP remain in place. Baseline immunosuppressive labs obtained on 07/23/2022: TB Gold negative, immunoglobulins within normal limits, SPEP did not reveal an abnormal protein bands, hepatitis B-, hepatitis C negative. No recurrent infections.  Discussed the importance of holding methotrexate if she develops signs or symptoms of an infection and to resume once the infection has completely cleared.  - Plan: COMPLETE METABOLIC PANEL WITH GFR, CBC with Differential/Platelet  Chronic right shoulder pain: She has good  range of motion of the right shoulder on examination today.  She has been experiencing intermittent discomfort and stiffness in the right shoulder.  Primary osteoarthritis of both hands: She has been experiencing increased stiffness in both hands.  She has tenderness  over several MCP and PIP joints on examination today.  Complete fist formation noted bilaterally.  Pain in both feet: X-rays of both feet were unremarkable on 07/19/2021.  She is not experiencing any increased discomfort in her feet at this time.  She has good range of motion of both ankle joints with no tenderness or synovitis.  No tenderness or synovitis over MTP joints.  Chronic pain of both knees - X-rays of both knees were consistent with mild osteoarthritic changes on 07/23/2022.  Patient presents today with increased pain and stiffness in both knee joints.  She is having swelling in both knees and has also had nocturnal pain bilaterally.  She has not been taking any over-the-counter products for pain relief.  She has not taken a prednisone taper recently.  She had a left knee joint cortisone injection performed on 11/18/2022 which provided significant relief but her symptoms have returned.  She requested a repeat left knee joint cortisone injection today.  She tolerated procedure well.  Procedure note was completed above.  A prednisone taper was sent to the pharmacy in anticipation of her traveling to [place] in 2 weeks.  Effusion, left knee - 28 mL of fluid was drawn off and sent for analysis on 07/23/2022.  Presents today with a recurrence of pain, stiffness, and swelling in the left knee.  She had a left knee joint cortisone injection performed on 11/18/2022 which provided significant relief but her symptoms have returned.  She started to have a flare about 3 weeks ago.  She requested a repeat cortisone injection today.  She tolerated procedure well.  Procedure note was completed above.  Aftercare was discussed.  She will be leaving for  [place] in 2 weeks and is planning on being active walking on a daily basis.  A prednisone taper starting at 20 mg tapering by 5 mg every 4 days was sent to the pharmacy to take if her symptoms do not improve.  Effusion, right knee: Small effusion in the right knee noted today.  A prednisone taper starting at 20 mg tapering by 5 mg every 4 days was sent to the pharmacy. She will be switching from oral to injectable methotrexate as discussed above.   Orders: Orders Placed This Encounter  Procedures   Large Joint Inj: L knee   COMPLETE METABOLIC PANEL WITH GFR   CBC with Differential/Platelet   Meds ordered this encounter  Medications   predniSONE (DELTASONE) 5 MG tablet    Sig: Take 4 tablets by mouth daily x4 days, 3 tablets daily x4 days, 2 tablets daily x4 days, 1 tablet daily x4 days.    Dispense:  40 tablet    Refill:  0    Follow-Up Instructions: Return in about 3 months (around 05/22/2023).   Ofilia Neas, PA-C  Note - This record has been created using Dragon software.  Chart creation errors have been sought, but may not always  have been located. Such creation errors do not reflect on  the standard of medical care.

## 2023-02-11 ENCOUNTER — Other Ambulatory Visit: Payer: Self-pay | Admitting: Rheumatology

## 2023-02-11 NOTE — Telephone Encounter (Signed)
Next Visit: 02/19/2023  Last Visit: 11/18/2022  Last Fill: 11/12/2022  DX: Inflammatory arthritis   Current Dose per office note 11/18/2022: Methotrexate 8 tablets by mouth once weekly   Labs: 11/18/2022 CBC and CMP WNL   Patient to update labs at [place] appointment on 02/19/2023.  Okay to refill Methotrexate?

## 2023-02-19 ENCOUNTER — Telehealth: Payer: Self-pay | Admitting: Pharmacist

## 2023-02-19 ENCOUNTER — Ambulatory Visit: Payer: BC Managed Care – PPO | Attending: Physician Assistant | Admitting: Physician Assistant

## 2023-02-19 ENCOUNTER — Encounter: Payer: Self-pay | Admitting: Physician Assistant

## 2023-02-19 VITALS — BP 102/72 | HR 80 | Resp 16 | Ht 66.0 in | Wt 171.2 lb

## 2023-02-19 DIAGNOSIS — Z79899 Other long term (current) drug therapy: Secondary | ICD-10-CM

## 2023-02-19 DIAGNOSIS — M25561 Pain in right knee: Secondary | ICD-10-CM

## 2023-02-19 DIAGNOSIS — M19042 Primary osteoarthritis, left hand: Secondary | ICD-10-CM

## 2023-02-19 DIAGNOSIS — M79671 Pain in right foot: Secondary | ICD-10-CM

## 2023-02-19 DIAGNOSIS — M25462 Effusion, left knee: Secondary | ICD-10-CM | POA: Diagnosis not present

## 2023-02-19 DIAGNOSIS — M25562 Pain in left knee: Secondary | ICD-10-CM

## 2023-02-19 DIAGNOSIS — M19041 Primary osteoarthritis, right hand: Secondary | ICD-10-CM

## 2023-02-19 DIAGNOSIS — M0600 Rheumatoid arthritis without rheumatoid factor, unspecified site: Secondary | ICD-10-CM

## 2023-02-19 DIAGNOSIS — M25511 Pain in right shoulder: Secondary | ICD-10-CM

## 2023-02-19 DIAGNOSIS — M79672 Pain in left foot: Secondary | ICD-10-CM

## 2023-02-19 DIAGNOSIS — M25461 Effusion, right knee: Secondary | ICD-10-CM

## 2023-02-19 DIAGNOSIS — M1990 Unspecified osteoarthritis, unspecified site: Secondary | ICD-10-CM

## 2023-02-19 DIAGNOSIS — G8929 Other chronic pain: Secondary | ICD-10-CM

## 2023-02-19 MED ORDER — TRIAMCINOLONE ACETONIDE 40 MG/ML IJ SUSP
40.0000 mg | INTRAMUSCULAR | Status: AC | PRN
  Administered 2023-02-19: 40 mg via INTRA_ARTICULAR

## 2023-02-19 MED ORDER — LIDOCAINE HCL 1 % IJ SOLN
1.5000 mL | INTRAMUSCULAR | Status: AC | PRN
  Administered 2023-02-19: 1.5 mL

## 2023-02-19 MED ORDER — PREDNISONE 5 MG PO TABS: ORAL_TABLET | ORAL | 0 refills | Status: AC

## 2023-02-19 NOTE — Telephone Encounter (Signed)
Submitted a Prior Authorization request to [facility] for OTREXUP via CoverMyMeds. Will update once we receive a response.  Key: [id number]

## 2023-02-19 NOTE — Telephone Encounter (Signed)
Please start Otrexup/Rasuvo BIV.  Dose: '20mg'$  SQ once weekly  Once approved, will need to be enrolled into copay card.  Patient trained on MTX vial/syringe if denied and/or unaffordable even after approval  She is going out of country for 1 week in about 2 weeks. She states she will be stateside again on 03/17/2023  Knox Saliva, PharmD, MPH, BCPS, CPP Clinical Pharmacist (Rheumatology and Pulmonology)

## 2023-02-19 NOTE — Progress Notes (Signed)
Patient currently on oral MTX '20mg'$  once weekly with folic acid '2mg'$  daily.  Plan is to transition to subcut formulation for increased efficacy. Patient prefers Otrexup/Rasuvo formulation if possible but willing to try vial/syringe if absolutely necessary  Educated patient on how to use a vial and syringe and reviewed injection technique with patient.  Patient was able to demonstrate proper technique for injections using vial and syringe.  Provided patient educational material regarding injection technique and storage of methotrexate.    We will try for Otrexup/Rasuvo first.  Knox Saliva, PharmD, MPH, BCPS, CPP Clinical Pharmacist (Rheumatology and Pulmonology)

## 2023-02-25 NOTE — Telephone Encounter (Signed)
Received form from [facility] with additional clinical questions.  Completed and faxed back  Case # [id number] Fax: [phone] Phone: [phone]  Knox Saliva, PharmD, MPH, BCPS, CPP Clinical Pharmacist (Rheumatology and Pulmonology)

## 2023-02-28 ENCOUNTER — Telehealth: Payer: Self-pay

## 2023-02-28 ENCOUNTER — Other Ambulatory Visit: Payer: Self-pay | Admitting: *Deleted

## 2023-02-28 DIAGNOSIS — Z79899 Other long term (current) drug therapy: Secondary | ICD-10-CM | POA: Diagnosis not present

## 2023-02-28 NOTE — Telephone Encounter (Signed)
Returned call to patient. She will be planning to come to clinic today for updated CBC and CMP as they were not able to be drawn at [place].  Once labs result she is ok with trying MTX vial/syringe since Otrexup has been denied.  Knox Saliva, PharmD, MPH, BCPS, CPP Clinical Pharmacist (Rheumatology and Pulmonology)

## 2023-02-28 NOTE — Telephone Encounter (Signed)
Patient contacted the office to return a call to the pharmacy. Patient states the call was about the options with her new Methotrexate injection. Patient's call back number is [phone]. Please advise.

## 2023-02-28 NOTE — Telephone Encounter (Signed)
Received a fax regarding Prior Authorization from [facility] for [place]. Authorization has been DENIED because patient must try and fail generic MTX injection  Patient was trained on MTX generic vial/syringe admin at [place].  ATC patient regarding denial for Otrexup. Unable to reach. Left VM requesting return call. Will f/u  Labs from 02/19/23 have not resulted. Routing to clinical team  Knox Saliva, PharmD, MPH, BCPS, CPP Clinical Pharmacist (Rheumatology and Pulmonology)

## 2023-02-28 NOTE — Telephone Encounter (Signed)
Returned call to patient. She will be planning to come to clinic today for updated CBC and CMP as they were not able to be drawn at [facility].  Once labs result she is ok with trying MTX vial/syringe since Otrexup has been denied.  Knox Saliva, PharmD, MPH, BCPS, CPP Clinical Pharmacist (Rheumatology and Pulmonology)

## 2023-03-01 LAB — CBC WITH DIFFERENTIAL/PLATELET
Absolute Monocytes: 321 cells/uL (ref 200–950)
Basophils Absolute: 51 cells/uL (ref 0–200)
Basophils Relative: 0.7 %
Eosinophils Absolute: 29 cells/uL (ref 15–500)
Eosinophils Relative: 0.4 %
HCT: 42.1 % (ref 35.0–45.0)
Hemoglobin: 14.1 g/dL (ref 11.7–15.5)
Lymphs Abs: 1570 cells/uL (ref 850–3900)
MCH: 32 pg (ref 27.0–33.0)
MCHC: 33.5 g/dL (ref 32.0–36.0)
MCV: 95.7 fL (ref 80.0–100.0)
MPV: 9.7 fL (ref 7.5–12.5)
Monocytes Relative: 4.4 %
Neutro Abs: 5329 cells/uL (ref 1500–7800)
Neutrophils Relative %: 73 %
Platelets: 423 10*3/uL — ABNORMAL HIGH (ref 140–400)
RBC: 4.4 10*6/uL (ref 3.80–5.10)
RDW: 13.1 % (ref 11.0–15.0)
Total Lymphocyte: 21.5 %
WBC: 7.3 10*3/uL (ref 3.8–10.8)

## 2023-03-01 LAB — COMPLETE METABOLIC PANEL WITH GFR
AG Ratio: 1.8 (calc) (ref 1.0–2.5)
ALT: 20 U/L (ref 6–29)
AST: 20 U/L (ref 10–30)
Albumin: 5 g/dL (ref 3.6–5.1)
Alkaline phosphatase (APISO): 86 U/L (ref 31–125)
BUN: 14 mg/dL (ref 7–25)
CO2: 26 mmol/L (ref 20–32)
Calcium: 9.9 mg/dL (ref 8.6–10.2)
Chloride: 99 mmol/L (ref 98–110)
Creat: 0.77 mg/dL (ref 0.50–0.99)
Globulin: 2.8 g/dL (calc) (ref 1.9–3.7)
Glucose, Bld: 123 mg/dL — ABNORMAL HIGH (ref 65–99)
Potassium: 5 mmol/L (ref 3.5–5.3)
Sodium: 137 mmol/L (ref 135–146)
Total Bilirubin: 0.8 mg/dL (ref 0.2–1.2)
Total Protein: 7.8 g/dL (ref 6.1–8.1)
eGFR: 99 mL/min/{1.73_m2} (ref >=60)

## 2023-03-03 MED ORDER — METHOTREXATE SODIUM CHEMO INJECTION 50 MG/2ML: 20.0000 mg | INTRAMUSCULAR | 0 refills | Status: DC

## 2023-03-03 MED ORDER — "BD TB SYRINGE 27G X 1/2"" 1 ML MISC": 12.0000 | 3 refills | Status: AC

## 2023-03-03 NOTE — Progress Notes (Signed)
Glucose is 123. Rest of CMP WNL.  Plt count is elevated-423-likely secondary to inflammation.  Rest of CBC Wnl.

## 2023-03-03 NOTE — Telephone Encounter (Signed)
Labs resulted and rx for MTX vial/syringe has been sent to pharmacy today.  Knox Saliva, PharmD, MPH, BCPS, CPP Clinical Pharmacist (Rheumatology and Pulmonology)

## 2023-04-23 DIAGNOSIS — M79671 Pain in right foot: Secondary | ICD-10-CM | POA: Diagnosis not present

## 2023-05-01 NOTE — Progress Notes (Signed)
Office Visit Note  Patient: Leah Ball             Date of Birth: [birth year]           MRN: [id number]             PCP: Jarrett Soho, PA-C Referring: Jarrett Soho, PA-C Visit Date: 05/15/2023 Occupation: @GUAROCC @  Subjective:  Medication monitoring   History of Present Illness: Leah Ball is a 42 y.o. female with history of seronegative rheumatoid arthritis and osteoarthritis.  Patient is currently on methotrexate 0.8 ml sq injections once weekly and folic acid 2 mg daily. Patient switched from oral to injectable MTX after her last office visit on 02/19/23.  She had a left knee joint cortisone injection on 02/19/23 and was given a prednisone taper at that time.  Patient states that she has had about a 75% improvement in her symptoms.  She states that she recently fractured the middle toe on her right foot so she has been more sedentary than usual.  She states she has gradually started to increase her activity level and by the end of the day is having some increased discomfort and a grinding sensation in her knee joints.  She denies any increased swelling in her knees.  She denies any other joint pain or joint swelling at this time.  She denies any new medical conditions.  She denies any recent or recurrent infections.   Activities of Daily Living:  Patient reports morning stiffness for 15-20 minutes.   Patient Denies nocturnal pain.  Difficulty dressing/grooming: Denies Difficulty climbing stairs: Reports Difficulty getting out of chair: Reports Difficulty using hands for taps, buttons, cutlery, and/or writing: Reports  Review of Systems  Constitutional:  Positive for fatigue.  HENT:  Positive for mouth dryness. Negative for mouth sores.   Eyes:  Positive for dryness.  Respiratory:  Negative for shortness of breath.   Cardiovascular:  Negative for chest pain and palpitations.  Gastrointestinal:  Negative for blood in stool, constipation and diarrhea.  Endocrine:  Negative for increased urination.  Genitourinary:  Negative for involuntary urination.  Musculoskeletal:  Positive for joint pain, gait problem, joint pain, joint swelling, muscle weakness and morning stiffness. Negative for myalgias, muscle tenderness and myalgias.  Skin:  Positive for sensitivity to sunlight. Negative for color change, rash and hair loss.  Allergic/Immunologic: Positive for susceptible to infections.  Neurological:  Negative for dizziness and headaches.  Hematological:  Negative for swollen glands.  Psychiatric/Behavioral:  Negative for depressed mood and sleep disturbance. The patient is not nervous/anxious.     PMFS History:  There are no problems to display for this patient.   Past Medical History:  Diagnosis Date   Rheumatoid arthritis (HCC)    Rosacea     Family History  Problem Relation Age of Onset   Heart attack Father    High Cholesterol Father    High Cholesterol Sister    Breast cancer Paternal Aunt 14   Breast cancer Paternal Grandmother 67   Healthy Son    Past Surgical History:  Procedure Laterality Date   dental implant  2006   WISDOM TOOTH EXTRACTION  1999   Social History   Social History Narrative   Not on file   Immunization History  Administered Date(s) Administered   PFIZER(Purple Top)SARS-COV-2 Vaccination 02/18/2020, 03/21/2020, 10/18/2020     Objective: Vital Signs: BP 102/70 (BP Location: Left Arm, Patient Position: Sitting, Cuff Size: Normal)   Pulse 90   Resp 16  Ht 5\' 6"  (1.676 m)   Wt 178 lb 6.4 oz (80.9 kg)   BMI 28.79 kg/m    Physical Exam Vitals and nursing note reviewed.  Constitutional:      Appearance: She is well-developed.  HENT:     Head: Normocephalic and atraumatic.  Eyes:     Conjunctiva/sclera: Conjunctivae normal.  Cardiovascular:     Rate and Rhythm: Normal rate and regular rhythm.     Heart sounds: Normal heart sounds.  Pulmonary:     Effort: Pulmonary effort is normal.     Breath sounds:  Normal breath sounds.  Abdominal:     General: Bowel sounds are normal.     Palpations: Abdomen is soft.  Musculoskeletal:     Cervical back: Normal range of motion.  Lymphadenopathy:     Cervical: No cervical adenopathy.  Skin:    General: Skin is warm and dry.     Capillary Refill: Capillary refill takes less than 2 seconds.  Neurological:     Mental Status: She is alert and oriented to person, place, and time.  Psychiatric:        Behavior: Behavior normal.      Musculoskeletal Exam: C-spine, thoracic spine, and lumbar spine good ROM.  Shoulder joints, elbow joints, wrist joints, MCPs, PIPs, and DIPs good ROM with no synovitis.  Complete fist formation bilaterally.  Hip joints have good ROM with some discomfort and stiffness in the left hip.  Small effusion but no warmth in the left knee.  Right knee has good ROM with no warmth or effusion.  Ankle joints have good ROM with no tenderness or joint swelling.   CDAI Exam: CDAI Score: -- Patient Global: 6 mm; Provider Global: 6 mm Swollen: --; Tender: -- Joint Exam 05/15/2023   No joint exam has been documented for this visit   There is currently no information documented on the homunculus. Go to the Rheumatology activity and complete the homunculus joint exam.  Investigation: No additional findings.  Imaging: No results found.  Recent Labs: Lab Results  Component Value Date   WBC 7.3 02/28/2023   HGB 14.1 02/28/2023   PLT 423 (H) 02/28/2023   NA 137 02/28/2023   K 5.0 02/28/2023   CL 99 02/28/2023   CO2 26 02/28/2023   GLUCOSE 123 (H) 02/28/2023   BUN 14 02/28/2023   CREATININE 0.77 02/28/2023   BILITOT 0.8 02/28/2023   AST 20 02/28/2023   ALT 20 02/28/2023   PROT 7.8 02/28/2023   CALCIUM 9.9 02/28/2023   GFRAA 119 04/20/2018   QFTBGOLDPLUS NEGATIVE 07/23/2022    Speciality Comments: No specialty comments available.  Procedures:  No procedures performed Allergies: Patient has no known allergies.    Assessment / Plan:     Visit Diagnoses: Seronegative rheumatoid arthritis (HCC) - ANA-, RF-, anti-CCP negative, 14-3-3 eta negative on 07/19/21: Patient has noticed about a 75% improvement in her symptoms since switching from oral to injectable methotrexate in March 2024.  She has been tolerating methotrexate 0.8 mL subcu injections once weekly along with folic acid 2 mg daily.  She has not missed any doses recently.  She had a left knee joint cortisone injection on 02/19/2023 at which time she was also prescribed a prednisone taper.  Her symptoms improved significantly after completing the course of prednisone.  She has gradually started to increase her activity level and has been having some increased discomfort and a grinding sensation in both knees by the end of the day.  She has not noticed any recurrence of knee joint swelling.  On examination she has a small effusion in the left knee but no warmth is noted today.  She has no other joint pain or inflammation at this time.  Different treatment options were discussed including adding on Enbrel as combination therapy but she declined at this time.  Also discussed the use of adding on Plaquenil as combination therapy but she would like to hold off on adding any medications at this time.  Discussed that if her left knee joint pain and inflammation persists or worsens I would recommend combination therapy.  She will follow-up in the office in 3 months and we will reassess at that time.  High risk medication use -Methotrexate 0.8 mg sq injections once weekly and folic acid 2 mg daily.  Switched from oral to injectable methotrexate in March 2024.  Oral Methotrexate was started in August 2023.  Previous therapy includes sulfasalazine. Naive to biologics-discussed adding enbrel as combo therapy but she would like to hold off at this time.  CBC and CMP updated on 02/28/23.  Orders for CBC and CMP released today. Her next lab work will be due in early September and  every 3 months.  No recent or recurrent infections.  Discussed the importance of holding rasuvo if she develops signs or symptoms of an infection and to resume once the infection has completley cleared.  - Plan: COMPLETE METABOLIC PANEL WITH GFR, CBC with Differential/Platelet  Chronic right shoulder pain: Resolved.  Good range of motion with no discomfort.  Primary osteoarthritis of both hands: No tenderness or synovitis noted.  Complete fist formation bilaterally.  Pain in both feet - X-rays of both feet were unremarkable on 07/19/2021.  Patient recently fractured her right third toe.  Chronic pain of both knees - X-rays of both knees were consistent with mild osteoarthritic changes on 07/23/2022.  Effusion, left knee - Cortisone injection on 02/19/23. Small effusion, no warmth noted today.  Patient was advised to notify us if her symptoms persist or worsen. Declined the use of enbrel as combo therapy.   Effusion, right knee: No warmth or effusion noted.  Orders: Orders Placed This Encounter  Procedures   COMPLETE METABOLIC PANEL WITH GFR   CBC with Differential/Platelet   No orders of the defined types were placed in this encounter.     Follow-Up Instructions: Return in about 3 months (around 08/15/2023) for Rheumatoid arthritis, Osteoarthritis.   Gearldine Bienenstock, PA-C  Note - This record has been created using Dragon software.  Chart creation errors have been sought, but may not always  have been located. Such creation errors do not reflect on  the standard of medical care.

## 2023-05-15 ENCOUNTER — Ambulatory Visit: Payer: BC Managed Care – PPO | Attending: Physician Assistant | Admitting: Physician Assistant

## 2023-05-15 ENCOUNTER — Encounter: Payer: Self-pay | Admitting: Physician Assistant

## 2023-05-15 VITALS — BP 102/70 | HR 90 | Resp 16 | Ht 66.0 in | Wt 178.4 lb

## 2023-05-15 DIAGNOSIS — G8929 Other chronic pain: Secondary | ICD-10-CM

## 2023-05-15 DIAGNOSIS — M25511 Pain in right shoulder: Secondary | ICD-10-CM | POA: Diagnosis not present

## 2023-05-15 DIAGNOSIS — M25461 Effusion, right knee: Secondary | ICD-10-CM

## 2023-05-15 DIAGNOSIS — M25462 Effusion, left knee: Secondary | ICD-10-CM

## 2023-05-15 DIAGNOSIS — M79672 Pain in left foot: Secondary | ICD-10-CM

## 2023-05-15 DIAGNOSIS — M19042 Primary osteoarthritis, left hand: Secondary | ICD-10-CM

## 2023-05-15 DIAGNOSIS — Z79899 Other long term (current) drug therapy: Secondary | ICD-10-CM

## 2023-05-15 DIAGNOSIS — M25561 Pain in right knee: Secondary | ICD-10-CM

## 2023-05-15 DIAGNOSIS — M25562 Pain in left knee: Secondary | ICD-10-CM

## 2023-05-15 DIAGNOSIS — M79671 Pain in right foot: Secondary | ICD-10-CM

## 2023-05-15 DIAGNOSIS — M0600 Rheumatoid arthritis without rheumatoid factor, unspecified site: Secondary | ICD-10-CM

## 2023-05-15 DIAGNOSIS — M19041 Primary osteoarthritis, right hand: Secondary | ICD-10-CM

## 2023-05-15 NOTE — Patient Instructions (Signed)
Standing Labs We placed an order today for your standing lab work.   Please have your standing labs drawn in September and every 3 months   Please have your labs drawn 2 weeks prior to your appointment so that the provider can discuss your lab results at your appointment, if possible.  Please note that you may see your imaging and lab results in MyChart before we have reviewed them. We will contact you once all results are reviewed. Please allow our office up to 72 hours to thoroughly review all of the results before contacting the office for clarification of your results.  WALK-IN LAB HOURS  Monday through Thursday from 8:00 am -12:30 pm and 1:00 pm-5:00 pm and Friday from 8:00 am-12:00 pm.  Patients with office visits requiring labs will be seen before walk-in labs.  You may encounter longer than normal wait times. Please allow additional time. Wait times may be shorter on  Monday and Thursday afternoons.  We do not book appointments for walk-in labs. We appreciate your patience and understanding with our staff.   Labs are drawn by [facility]. Please bring your co-pay at the time of your lab draw.  You may receive a bill from [facility] for your lab work.  Please note if you are on Hydroxychloroquine and and an order has been placed for a Hydroxychloroquine level,  you will need to have it drawn 4 hours or more after your last dose.  If you wish to have your labs drawn at another location, please call the office 24 hours in advance so we can fax the orders.  The office is located at [address], [address], [place], [place] [address]   If you have any questions regarding directions or hours of operation,  please call [phone].   As a reminder, please drink plenty of water prior to coming for your lab work. Thanks!  

## 2023-05-16 LAB — COMPLETE METABOLIC PANEL WITH GFR
AG Ratio: 2.1 (calc) (ref 1.0–2.5)
ALT: 34 U/L — ABNORMAL HIGH (ref 6–29)
AST: 30 U/L (ref 10–30)
Albumin: 5.1 g/dL (ref 3.6–5.1)
Alkaline phosphatase (APISO): 82 U/L (ref 31–125)
BUN: 12 mg/dL (ref 7–25)
CO2: 28 mmol/L (ref 20–32)
Calcium: 10.1 mg/dL (ref 8.6–10.2)
Chloride: 103 mmol/L (ref 98–110)
Creat: 0.74 mg/dL (ref 0.50–0.99)
Globulin: 2.4 g/dL (calc) (ref 1.9–3.7)
Glucose, Bld: 71 mg/dL (ref 65–99)
Potassium: 4.5 mmol/L (ref 3.5–5.3)
Sodium: 141 mmol/L (ref 135–146)
Total Bilirubin: 0.5 mg/dL (ref 0.2–1.2)
Total Protein: 7.5 g/dL (ref 6.1–8.1)
eGFR: 104 mL/min/{1.73_m2} (ref >=60)

## 2023-05-16 LAB — CBC WITH DIFFERENTIAL/PLATELET
Absolute Monocytes: 279 cells/uL (ref 200–950)
Basophils Absolute: 41 cells/uL (ref 0–200)
Basophils Relative: 0.9 %
Eosinophils Absolute: 32 cells/uL (ref 15–500)
Eosinophils Relative: 0.7 %
HCT: 39.5 % (ref 35.0–45.0)
Hemoglobin: 12.7 g/dL (ref 11.7–15.5)
Lymphs Abs: 1107 cells/uL (ref 850–3900)
MCH: 32.4 pg (ref 27.0–33.0)
MCHC: 32.2 g/dL (ref 32.0–36.0)
MCV: 100.8 fL — ABNORMAL HIGH (ref 80.0–100.0)
MPV: 9.6 fL (ref 7.5–12.5)
Monocytes Relative: 6.2 %
Neutro Abs: 3042 cells/uL (ref 1500–7800)
Neutrophils Relative %: 67.6 %
Platelets: 403 10*3/uL — ABNORMAL HIGH (ref 140–400)
RBC: 3.92 10*6/uL (ref 3.80–5.10)
RDW: 12.8 % (ref 11.0–15.0)
Total Lymphocyte: 24.6 %
WBC: 4.5 10*3/uL (ref 3.8–10.8)

## 2023-05-16 NOTE — Progress Notes (Signed)
Plt count remains borderline elevated but has improved. Rest of CBC WNL. ALT is borderline elevated-34. Please clarify if she has been taking any tylenol or alcohol use?  Rest of CMP WNL

## 2023-05-31 ENCOUNTER — Other Ambulatory Visit: Payer: Self-pay | Admitting: Physician Assistant

## 2023-06-02 NOTE — Telephone Encounter (Signed)
Last Fill: 03/03/2023  Labs: 05/15/2023 Plt count remains borderline elevated but has improved. Rest of CBC WNL. ALT is borderline elevated-34. Rest of CMP WNL   Next Visit: 08/06/2023  Last Visit: 05/15/2023  DX: Seronegative rheumatoid arthritis   Current Dose per office note 05/15/2023: Methotrexate 0.8 mg sq injections once weekly   Okay to refill Methotrexate?

## 2023-06-12 DIAGNOSIS — L738 Other specified follicular disorders: Secondary | ICD-10-CM | POA: Diagnosis not present

## 2023-06-12 DIAGNOSIS — Q825 Congenital non-neoplastic nevus: Secondary | ICD-10-CM | POA: Diagnosis not present

## 2023-06-12 DIAGNOSIS — D2262 Melanocytic nevi of left upper limb, including shoulder: Secondary | ICD-10-CM | POA: Diagnosis not present

## 2023-06-12 DIAGNOSIS — D224 Melanocytic nevi of scalp and neck: Secondary | ICD-10-CM | POA: Diagnosis not present

## 2023-07-04 IMAGING — MG MM DIGITAL SCREENING BILAT W/ TOMO AND CAD
8 series · 9 of 24 positions shown · non-contrast
Comparison: None.

CLINICAL DATA: Screening.

EXAM:
DIGITAL SCREENING BILATERAL MAMMOGRAM WITH TOMOSYNTHESIS AND CAD
TECHNIQUE: Bilateral screening digital craniocaudal and mediolateral oblique
mammograms were obtained. Bilateral screening digital breast
tomosynthesis was performed. The images were evaluated with
computer-aided detection.

[L MLO synth-2D]
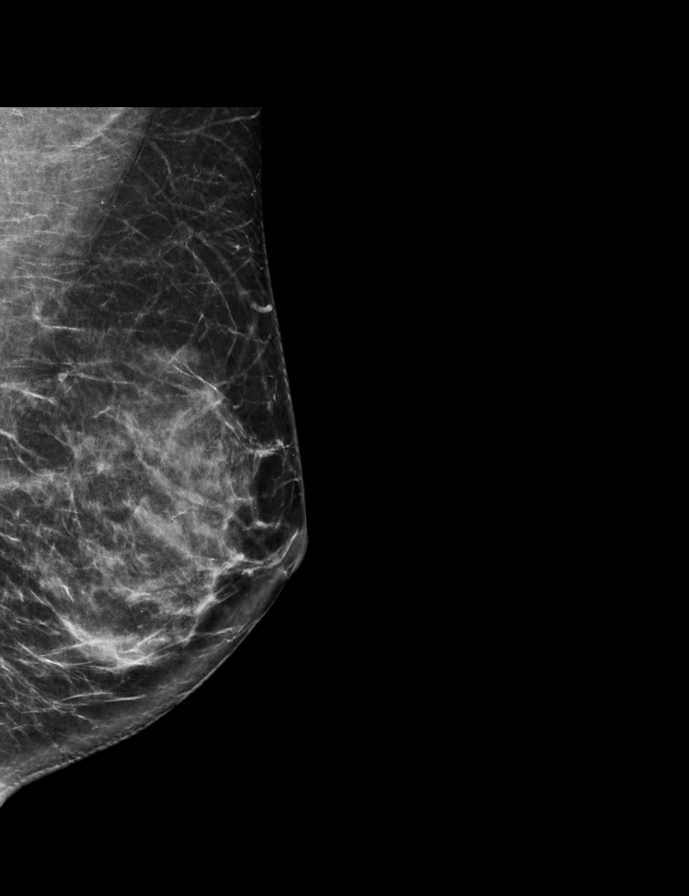

[R CC synth-2D]
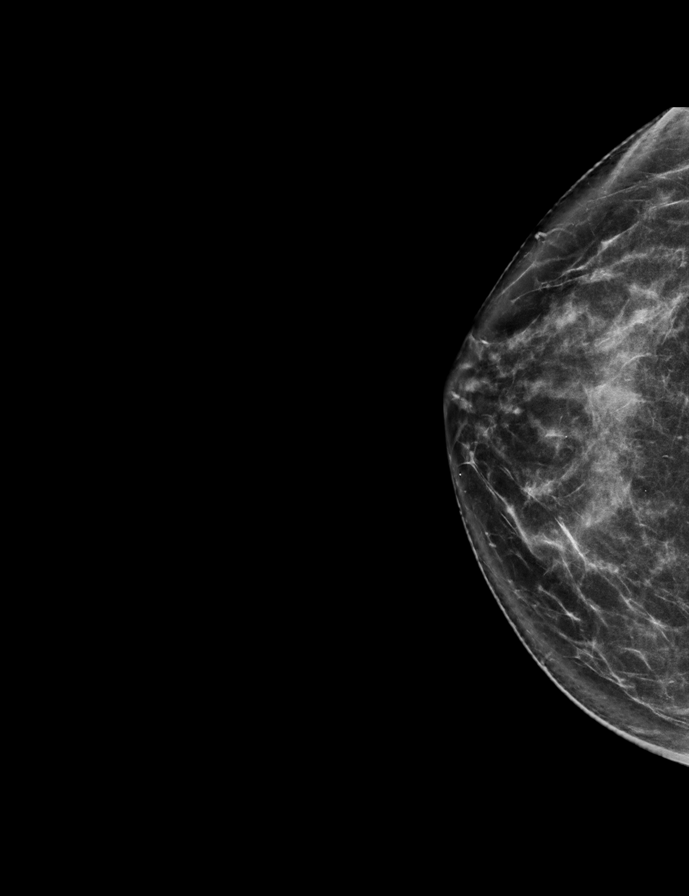

[R MLO synth-2D]
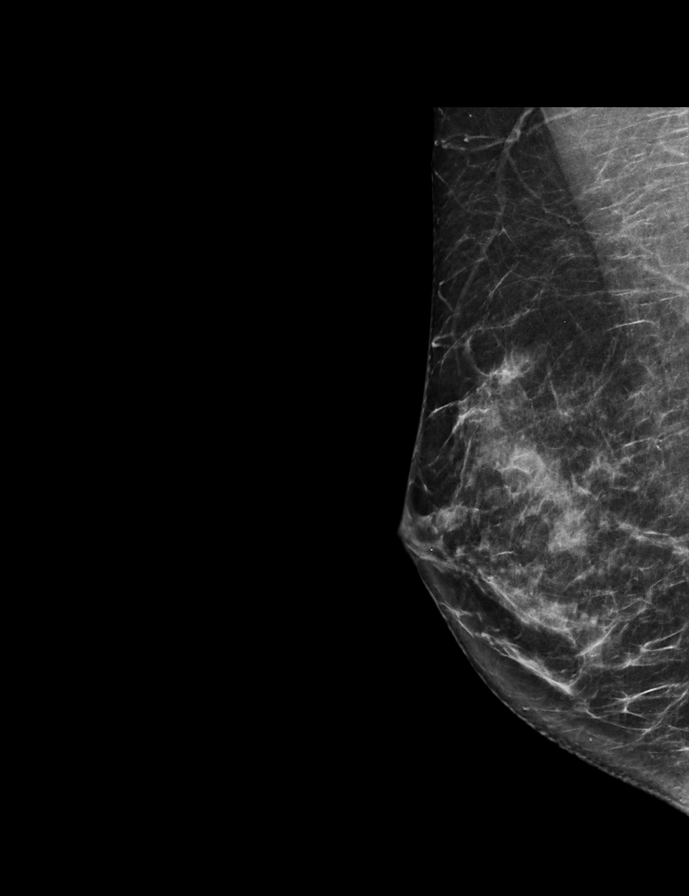

[L CC synth-2D]
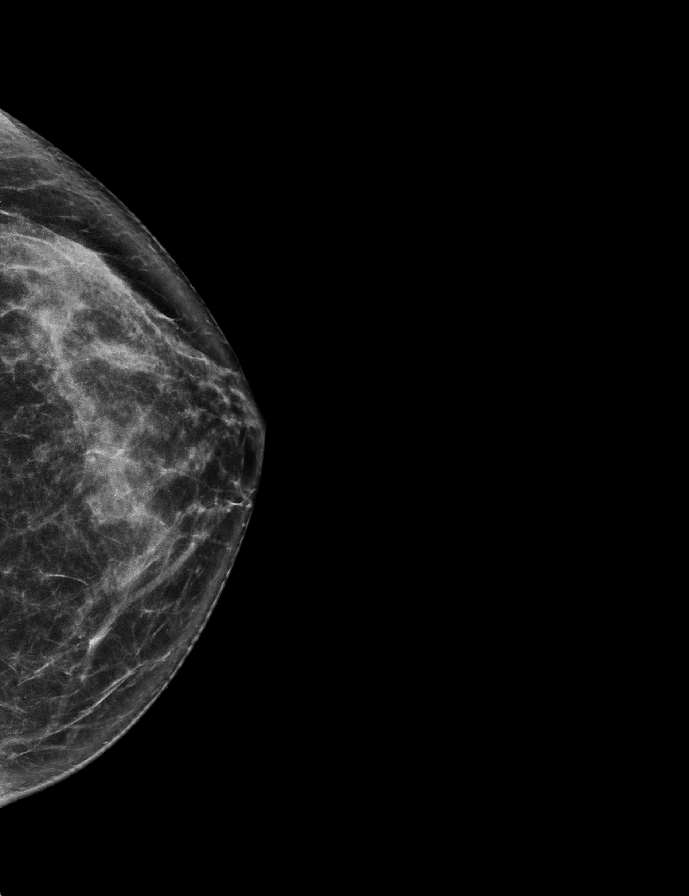

[R CC tomo · 2 of 74 frames shown]
[frame 24/74]
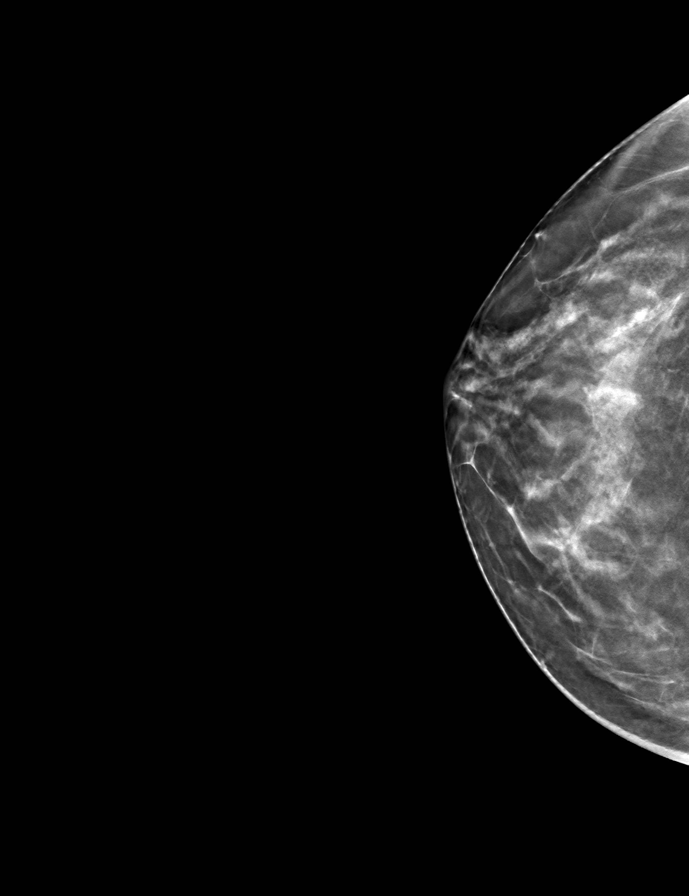
[frame 37/74]
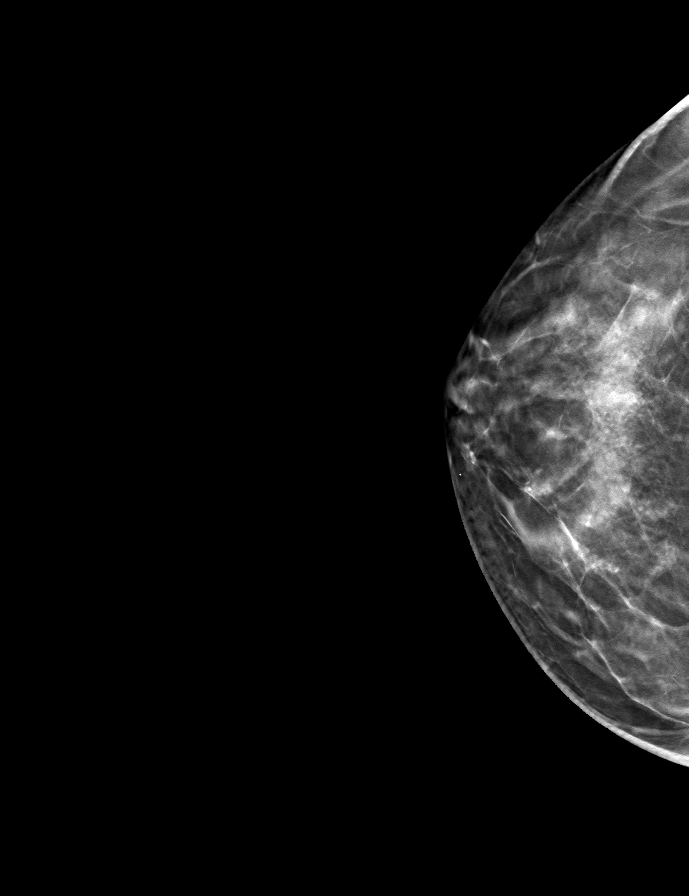

[R MLO tomo · tomo slice 35/68.0]
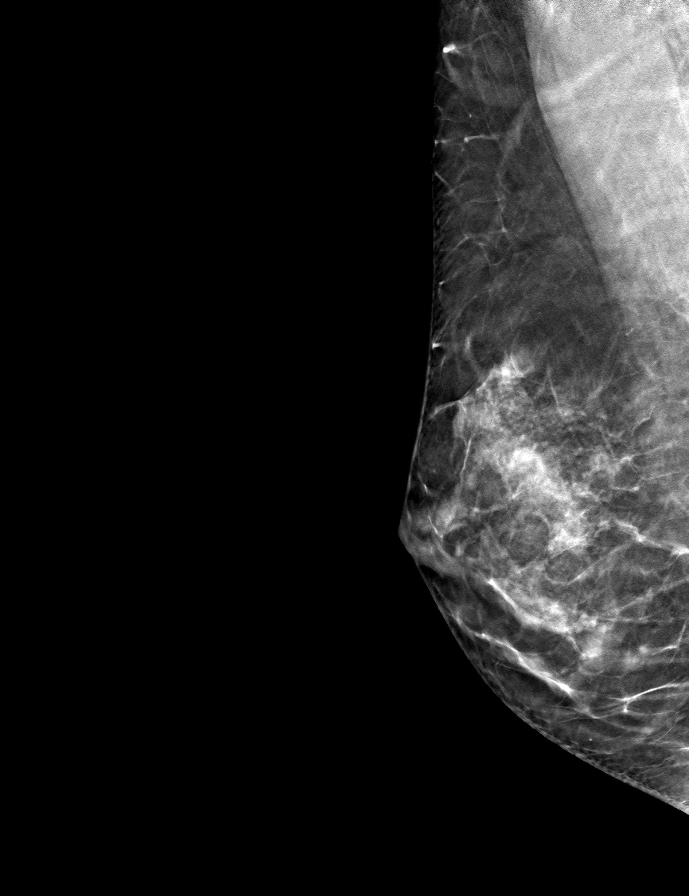

[L MLO tomo · tomo slice 35/69.0]
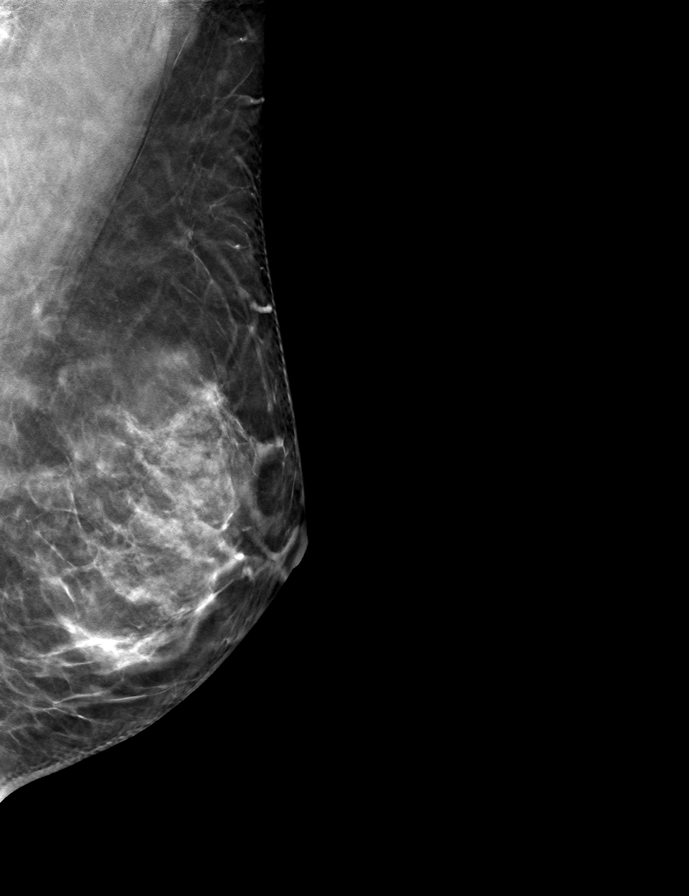

[L CC tomo · tomo slice 33/65.0]
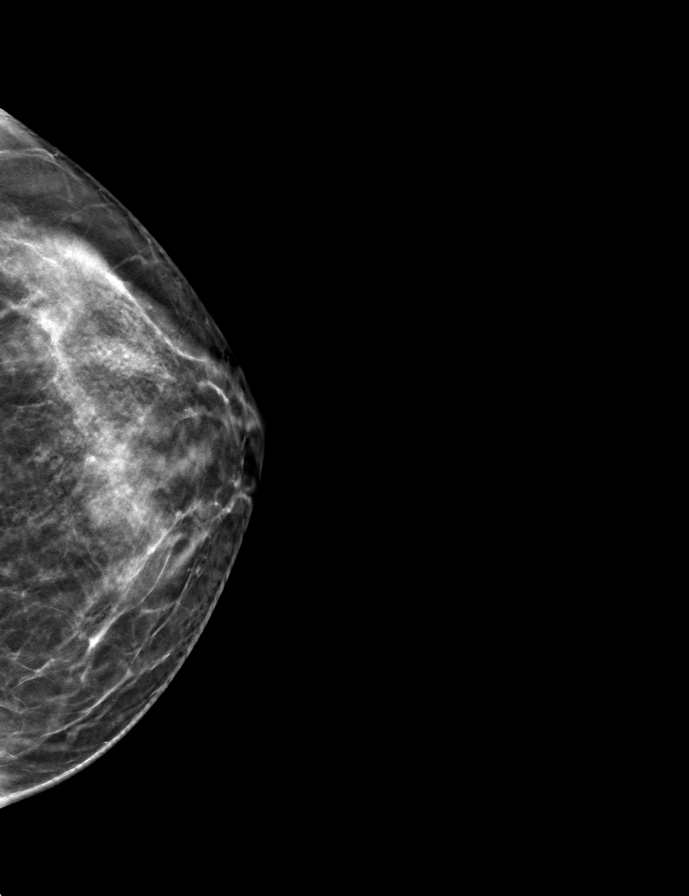

[9 of 24 positions shown; findings below may reference images not displayed]

ACR Breast Density Category c: The breast tissue is heterogeneously
dense, which may obscure small masses
FINDINGS: There are no findings suspicious for malignancy.
IMPRESSION: No mammographic evidence of malignancy. A result letter of this
screening mammogram will be mailed directly to the patient.

RECOMMENDATION:
Screening mammogram in one year. (Code:C8-T-HNK)

BI-RADS CATEGORY  1: Negative.

## 2023-07-23 NOTE — Progress Notes (Deleted)
Office Visit Note  Patient: Leah Ball             Date of Birth: 1981/08/06           MRN: [id number]             PCP: Jarrett Soho, PA-C Referring: Jarrett Soho, PA-C Visit Date: 08/06/2023 Occupation: @GUAROCC @  Subjective:    History of Present Illness: Leah Ball is a 42 y.o. female with history of seronegative rheumatoid arthritis and osteoarthritis.    Methotrexate 0.8 mg sq injections once weekly and folic acid 2 mg daily.  Switched from oral to injectable methotrexate in March 2024.  Oral Methotrexate was started in August 2023.  Previous therapy includes sulfasalazine. Naive to biologics-discussed adding enbrel as combo therapy but she would like to hold off at this time.  CBC and CMP updated on 05/15/23. Orders for CBC and CMP released today.  Discussed the importance of holding methotrexate if she develops signs or symptoms of an infection and to resume once the infection has completely cleared.   Activities of Daily Living:  Patient reports morning stiffness for *** {minute/hour:19697}.   Patient {ACTIONS;DENIES/REPORTS:21021675::"Denies"} nocturnal pain.  Difficulty dressing/grooming: {ACTIONS;DENIES/REPORTS:21021675::"Denies"} Difficulty climbing stairs: {ACTIONS;DENIES/REPORTS:21021675::"Denies"} Difficulty getting out of chair: {ACTIONS;DENIES/REPORTS:21021675::"Denies"} Difficulty using hands for taps, buttons, cutlery, and/or writing: {ACTIONS;DENIES/REPORTS:21021675::"Denies"}  No Rheumatology ROS completed.   PMFS History:  There are no problems to display for this patient.   Past Medical History:  Diagnosis Date   Rheumatoid arthritis (HCC)    Rosacea     Family History  Problem Relation Age of Onset   Heart attack Father    High Cholesterol Father    High Cholesterol Sister    Breast cancer Paternal Aunt 79   Breast cancer Paternal Grandmother 5   Healthy Son    Past Surgical History:  Procedure Laterality Date   dental  implant  2006   WISDOM TOOTH EXTRACTION  1999   Social History   Social History Narrative   Not on file   Immunization History  Administered Date(s) Administered   PFIZER(Purple Top)SARS-COV-2 Vaccination 02/18/2020, 03/21/2020, 10/18/2020     Objective: Vital Signs: There were no vitals taken for this visit.   Physical Exam Vitals and nursing note reviewed.  Constitutional:      Appearance: She is well-developed.  HENT:     Head: Normocephalic and atraumatic.  Eyes:     Conjunctiva/sclera: Conjunctivae normal.  Cardiovascular:     Rate and Rhythm: Normal rate and regular rhythm.     Heart sounds: Normal heart sounds.  Pulmonary:     Effort: Pulmonary effort is normal.     Breath sounds: Normal breath sounds.  Abdominal:     General: Bowel sounds are normal.     Palpations: Abdomen is soft.  Musculoskeletal:     Cervical back: Normal range of motion.  Lymphadenopathy:     Cervical: No cervical adenopathy.  Skin:    General: Skin is warm and dry.     Capillary Refill: Capillary refill takes less than 2 seconds.  Neurological:     Mental Status: She is alert and oriented to person, place, and time.  Psychiatric:        Behavior: Behavior normal.      Musculoskeletal Exam: ***  CDAI Exam: CDAI Score: -- Patient Global: --; Provider Global: -- Swollen: --; Tender: -- Joint Exam 08/06/2023   No joint exam has been documented for this visit   There is currently  no information documented on the homunculus. Go to the Rheumatology activity and complete the homunculus joint exam.  Investigation: No additional findings.  Imaging: No results found.  Recent Labs: Lab Results  Component Value Date   WBC 4.5 05/15/2023   HGB 12.7 05/15/2023   PLT 403 (H) 05/15/2023   NA 141 05/15/2023   K 4.5 05/15/2023   CL 103 05/15/2023   CO2 28 05/15/2023   GLUCOSE 71 05/15/2023   BUN 12 05/15/2023   CREATININE 0.74 05/15/2023   BILITOT 0.5 05/15/2023   AST 30  05/15/2023   ALT 34 (H) 05/15/2023   PROT 7.5 05/15/2023   CALCIUM 10.1 05/15/2023   GFRAA 119 04/20/2018   QFTBGOLDPLUS NEGATIVE 07/23/2022    Speciality Comments: No specialty comments available.  Procedures:  No procedures performed Allergies: Patient has no known allergies.   Assessment / Plan:     Visit Diagnoses: Seronegative rheumatoid arthritis (HCC)  High risk medication use  Chronic right shoulder pain  Primary osteoarthritis of both hands  Pain in both feet  Chronic pain of both knees  Effusion, left knee  Effusion, right knee  Orders: No orders of the defined types were placed in this encounter.  No orders of the defined types were placed in this encounter.   Face-to-face time spent with patient was *** minutes. Greater than 50% of time was spent in counseling and coordination of care.  Follow-Up Instructions: No follow-ups on file.   Gearldine Bienenstock, PA-C  Note - This record has been created using Dragon software.  Chart creation errors have been sought, but may not always  have been located. Such creation errors do not reflect on  the standard of medical care.

## 2023-08-06 ENCOUNTER — Ambulatory Visit: Payer: BC Managed Care – PPO | Admitting: Physician Assistant

## 2023-08-06 ENCOUNTER — Other Ambulatory Visit: Payer: Self-pay | Admitting: Rheumatology

## 2023-08-06 DIAGNOSIS — Z79899 Other long term (current) drug therapy: Secondary | ICD-10-CM

## 2023-08-06 DIAGNOSIS — M25462 Effusion, left knee: Secondary | ICD-10-CM

## 2023-08-06 DIAGNOSIS — M19041 Primary osteoarthritis, right hand: Secondary | ICD-10-CM

## 2023-08-06 DIAGNOSIS — G8929 Other chronic pain: Secondary | ICD-10-CM

## 2023-08-06 DIAGNOSIS — M25461 Effusion, right knee: Secondary | ICD-10-CM

## 2023-08-06 DIAGNOSIS — M0600 Rheumatoid arthritis without rheumatoid factor, unspecified site: Secondary | ICD-10-CM

## 2023-08-06 DIAGNOSIS — M79672 Pain in left foot: Secondary | ICD-10-CM

## 2023-08-06 NOTE — Telephone Encounter (Signed)
Last Fill: 07/27/2023  Next Visit: 08/06/2023  Last Visit: 05/15/2023  Dx: Seronegative rheumatoid arthritis   Current Dose per office note on 05/15/2023: folic acid 2 mg daily   Okay to refill Folic Acid?

## 2023-08-11 ENCOUNTER — Other Ambulatory Visit: Payer: Self-pay | Admitting: Physician Assistant

## 2023-08-11 NOTE — Telephone Encounter (Signed)
Last Fill: 06/02/2023  Labs: 05/15/2023 Plt count remains borderline elevated but has improved. Rest of CBC WNL. ALT is borderline elevated-34. Please clarify if she has been taking any tylenol or alcohol use? Rest of CMP WNL  Next Visit: 09/02/2023  Last Visit: 05/15/2023  DX: Seronegative rheumatoid arthritis   Current Dose per office note 05/15/2023: methotrexate 0.8 mL subcu injections once weekly  Okay to refill Methotrexate?

## 2023-08-11 NOTE — Telephone Encounter (Signed)
[facility] labs are due at the end of August, 2024.

## 2023-08-11 NOTE — Telephone Encounter (Signed)
Due to update lab work

## 2023-08-19 NOTE — Progress Notes (Deleted)
Office Visit Note  Patient: Leah Ball             Date of Birth: Dec 04, 1981           MRN: [id number]             PCP: Jarrett Soho, PA-C Referring: Jarrett Soho, PA-C Visit Date: 09/02/2023 Occupation: @GUAROCC @  Subjective:    History of Present Illness: Leah Ball is a 42 y.o. female with history of seronegative rheumatoid arthritis and osteoarthritis.    Methotrexate 0.8 mg sq injections once weekly and folic acid 2 mg daily.  Switched from oral to injectable methotrexate in March 2024.  Oral Methotrexate was started in August 2023.  Previous therapy includes sulfasalazine. Naive to biologics-discussed adding enbrel as combo therapy but she would like to hold off at this time.  CBC and CMP updated on 05/15/23. Orders for CBC and CMP released today.  Discussed the importance of holding methotrexate if she develops signs or symptoms of an infection and to resume once the infection has completely cleared.   Activities of Daily Living:  Patient reports morning stiffness for *** {minute/hour:19697}.   Patient {ACTIONS;DENIES/REPORTS:21021675::"Denies"} nocturnal pain.  Difficulty dressing/grooming: {ACTIONS;DENIES/REPORTS:21021675::"Denies"} Difficulty climbing stairs: {ACTIONS;DENIES/REPORTS:21021675::"Denies"} Difficulty getting out of chair: {ACTIONS;DENIES/REPORTS:21021675::"Denies"} Difficulty using hands for taps, buttons, cutlery, and/or writing: {ACTIONS;DENIES/REPORTS:21021675::"Denies"}  No Rheumatology ROS completed.   PMFS History:  There are no problems to display for this patient.   Past Medical History:  Diagnosis Date  . Rheumatoid arthritis (HCC)   . Rosacea     Family History  Problem Relation Age of Onset  . Heart attack Father   . High Cholesterol Father   . High Cholesterol Sister   . Breast cancer Paternal Aunt 78  . Breast cancer Paternal Grandmother 89  . Healthy Son    Past Surgical History:  Procedure Laterality Date  .  dental implant  2006  . WISDOM TOOTH EXTRACTION  1999   Social History   Social History Narrative  . Not on file   Immunization History  Administered Date(s) Administered  . PFIZER(Purple Top)SARS-COV-2 Vaccination 02/18/2020, 03/21/2020, 10/18/2020     Objective: Vital Signs: There were no vitals taken for this visit.   Physical Exam Vitals and nursing note reviewed.  Constitutional:      Appearance: She is well-developed.  HENT:     Head: Normocephalic and atraumatic.  Eyes:     Conjunctiva/sclera: Conjunctivae normal.  Cardiovascular:     Rate and Rhythm: Normal rate and regular rhythm.     Heart sounds: Normal heart sounds.  Pulmonary:     Effort: Pulmonary effort is normal.     Breath sounds: Normal breath sounds.  Abdominal:     General: Bowel sounds are normal.     Palpations: Abdomen is soft.  Musculoskeletal:     Cervical back: Normal range of motion.  Lymphadenopathy:     Cervical: No cervical adenopathy.  Skin:    General: Skin is warm and dry.     Capillary Refill: Capillary refill takes less than 2 seconds.  Neurological:     Mental Status: She is alert and oriented to person, place, and time.  Psychiatric:        Behavior: Behavior normal.     Musculoskeletal Exam: ***  CDAI Exam: CDAI Score: -- Patient Global: --; Provider Global: -- Swollen: --; Tender: -- Joint Exam 09/02/2023   No joint exam has been documented for this visit   There is currently no  information documented on the homunculus. Go to the Rheumatology activity and complete the homunculus joint exam.  Investigation: No additional findings.  Imaging: No results found.  Recent Labs: Lab Results  Component Value Date   WBC 4.5 05/15/2023   HGB 12.7 05/15/2023   PLT 403 (H) 05/15/2023   NA 141 05/15/2023   K 4.5 05/15/2023   CL 103 05/15/2023   CO2 28 05/15/2023   GLUCOSE 71 05/15/2023   BUN 12 05/15/2023   CREATININE 0.74 05/15/2023   BILITOT 0.5 05/15/2023   AST  30 05/15/2023   ALT 34 (H) 05/15/2023   PROT 7.5 05/15/2023   CALCIUM 10.1 05/15/2023   GFRAA 119 04/20/2018   QFTBGOLDPLUS NEGATIVE 07/23/2022    Speciality Comments: No specialty comments available.  Procedures:  No procedures performed Allergies: Patient has no known allergies.   Assessment / Plan:     Visit Diagnoses: Seronegative rheumatoid arthritis (HCC)  High risk medication use  Chronic right shoulder pain  Primary osteoarthritis of both hands  Pain in both feet  Chronic pain of both knees  Effusion, left knee  Effusion, right knee  Orders: No orders of the defined types were placed in this encounter.  No orders of the defined types were placed in this encounter.   Face-to-face time spent with patient was *** minutes. Greater than 50% of time was spent in counseling and coordination of care.  Follow-Up Instructions: No follow-ups on file.   Gearldine Bienenstock, PA-C  Note - This record has been created using Dragon software.  Chart creation errors have been sought, but may not always  have been located. Such creation errors do not reflect on  the standard of medical care.

## 2023-09-02 ENCOUNTER — Ambulatory Visit: Payer: BC Managed Care – PPO | Admitting: Physician Assistant

## 2023-09-02 DIAGNOSIS — M19041 Primary osteoarthritis, right hand: Secondary | ICD-10-CM

## 2023-09-02 DIAGNOSIS — M25461 Effusion, right knee: Secondary | ICD-10-CM

## 2023-09-02 DIAGNOSIS — M25462 Effusion, left knee: Secondary | ICD-10-CM

## 2023-09-02 DIAGNOSIS — G8929 Other chronic pain: Secondary | ICD-10-CM

## 2023-09-02 DIAGNOSIS — Z79899 Other long term (current) drug therapy: Secondary | ICD-10-CM

## 2023-09-02 DIAGNOSIS — M0600 Rheumatoid arthritis without rheumatoid factor, unspecified site: Secondary | ICD-10-CM

## 2023-09-02 DIAGNOSIS — M79671 Pain in right foot: Secondary | ICD-10-CM

## 2023-09-15 NOTE — Progress Notes (Signed)
Office Visit Note  Patient: Leah Ball             Date of Birth: 08-17-1981           MRN: [id number]             PCP: Jarrett Soho, PA-C Referring: Jarrett Soho, PA-C Visit Date: 09/29/2023 Occupation: @GUAROCC @  Subjective:  Medication monitoring   History of Present Illness: Leah Ball is a 42 y.o. female with history of seronegative rheumatoid arthritis and osteoarthritis.  She is currently on Methotrexate 0.8 ml sq injections once weekly and folic acid 2 mg daily.  She has been tolerating methotrexate without any side effects or injection site reactions.  She has not missed any doses of methotrexate recently.  She denies any signs or symptoms of a flare.  She has noted some increased stiffness especially first thing in the mornings or after sitting for prolonged periods of time with the cooler weather temperatures but denies any knee joint effusions lately.  Her symptoms have been more manageable the longer that she has been on methotrexate.  She does not want to add any medications at this time.    Activities of Daily Living:  Patient reports morning stiffness for 30 minutes.   Patient Reports nocturnal pain.  Difficulty dressing/grooming: Denies Difficulty climbing stairs: Denies Difficulty getting out of chair: Reports Difficulty using hands for taps, buttons, cutlery, and/or writing: Reports  Review of Systems  Constitutional:  Positive for fatigue.  HENT:  Positive for mouth dryness. Negative for mouth sores.   Eyes:  Positive for dryness.  Respiratory:  Negative for shortness of breath.   Cardiovascular:  Negative for chest pain and palpitations.  Gastrointestinal:  Negative for blood in stool, constipation and diarrhea.  Endocrine: Negative for increased urination.  Genitourinary:  Negative for involuntary urination.  Musculoskeletal:  Positive for joint pain, joint pain, joint swelling and morning stiffness. Negative for gait problem, myalgias,  muscle weakness, muscle tenderness and myalgias.  Skin:  Positive for sensitivity to sunlight. Negative for color change, rash and hair loss.  Allergic/Immunologic: Positive for susceptible to infections.  Neurological:  Positive for headaches and weakness. Negative for dizziness and numbness.  Hematological:  Negative for swollen glands.  Psychiatric/Behavioral:  Positive for sleep disturbance. Negative for depressed mood. The patient is nervous/anxious.     PMFS History:  There are no problems to display for this patient.   Past Medical History:  Diagnosis Date   Rheumatoid arthritis (HCC)    Rosacea     Family History  Problem Relation Age of Onset   Heart attack Father    High Cholesterol Father    High Cholesterol Sister    Breast cancer Paternal Aunt 47   Breast cancer Paternal Grandmother 24   Healthy Son    Past Surgical History:  Procedure Laterality Date   dental implant  2006   WISDOM TOOTH EXTRACTION  1999   Social History   Social History Narrative   Not on file   Immunization History  Administered Date(s) Administered   PFIZER(Purple Top)SARS-COV-2 Vaccination 02/18/2020, 03/21/2020, 10/18/2020   Pfizer(Comirnaty)Fall Seasonal Vaccine 12 years and older 09/23/2023     Objective: Vital Signs: BP 105/70 (BP Location: Left Arm, Patient Position: Sitting, Cuff Size: Normal)   Pulse 79   Resp 14   Ht 5\' 6"  (1.676 m)   Wt 181 lb (82.1 kg)   BMI 29.21 kg/m    Physical Exam Vitals and nursing note reviewed.  Constitutional:      Appearance: She is well-developed.  HENT:     Head: Normocephalic and atraumatic.  Eyes:     Conjunctiva/sclera: Conjunctivae normal.  Cardiovascular:     Rate and Rhythm: Normal rate and regular rhythm.     Heart sounds: Normal heart sounds.  Pulmonary:     Effort: Pulmonary effort is normal.     Breath sounds: Normal breath sounds.  Abdominal:     General: Bowel sounds are normal.     Palpations: Abdomen is soft.   Musculoskeletal:     Cervical back: Normal range of motion.  Lymphadenopathy:     Cervical: No cervical adenopathy.  Skin:    General: Skin is warm and dry.     Capillary Refill: Capillary refill takes less than 2 seconds.  Neurological:     Mental Status: She is alert and oriented to person, place, and time.  Psychiatric:        Behavior: Behavior normal.      Musculoskeletal Exam: C-spine, thoracic spine, lumbar spine have good range of motion.  Shoulder joints have some stiffness with range of motion bilaterally.  Elbow joints and wrist joints have good range of motion with no tenderness or synovitis.  Some tenderness over the left fourth and fifth MCP joints but no synovitis noted.  Complete fist formation bilaterally.  Hip joints have good range of motion with no groin pain.  Knee joints have good range of motion with no warmth or effusion.  Ankle joints have good range of motion with no tenderness or synovitis.  No tenderness or synovitis over MTP joints.  CDAI Exam: CDAI Score: 11  Patient Global: 50 / 100; Provider Global: 30 / 100 Swollen: 0 ; Tender: 3  Joint Exam 09/29/2023      Right  Left  MCP 4      Tender  MCP 5      Tender  Knee      Tender     Investigation: No additional findings.  Imaging: No results found.  Recent Labs: Lab Results  Component Value Date   WBC 4.5 05/15/2023   HGB 12.7 05/15/2023   PLT 403 (H) 05/15/2023   NA 141 05/15/2023   K 4.5 05/15/2023   CL 103 05/15/2023   CO2 28 05/15/2023   GLUCOSE 71 05/15/2023   BUN 12 05/15/2023   CREATININE 0.74 05/15/2023   BILITOT 0.5 05/15/2023   AST 30 05/15/2023   ALT 34 (H) 05/15/2023   PROT 7.5 05/15/2023   CALCIUM 10.1 05/15/2023   GFRAA 119 04/20/2018   QFTBGOLDPLUS NEGATIVE 07/23/2022    Speciality Comments: No specialty comments available.  Procedures:  No procedures performed Allergies: Patient has no known allergies.   Assessment / Plan:     Visit Diagnoses: Seronegative  rheumatoid arthritis (HCC): She has no synovitis on examination today.  Overall her rheumatoid arthritis has been stable on methotrexate 0.8 mL sq injections once weekly as monotherapy.  She has been tolerating methotrexate without any side effects or injection site reactions.  She has not had any interruptions in therapy.  She has not had any recurrence of knee joint effusions but has some stiffness first thing in the morning and after sitting for prolonged periods of time.  She has some tenderness over the left fourth and fifth MCP joints but no synovitis was noted today.  She is apprehensive to add any medications at this time since her symptoms have been stable.  She will remain  on methotrexate 0.8 mg sq injections once weekly as monotherapy.  She was advised to notify us if she develops signs or symptoms of a flare.  She will follow-up in the office in 5 months or sooner if needed.  High risk medication use -Methotrexate 0.8 mg sq injections once weekly and folic acid 2 mg daily.  Switched from oral to injectable methotrexate in March 2024.  Oral Methotrexate was started in August 2023.  Contraception-IUD-mirena  Previous therapy includes sulfasalazine. Naive to biologics-apprehensive to add any medications at this time.  CBC and CMP updated on 05/15/23. Orders for CBC and CMP released today.  Her next lab work will be due in January and every 3 months.  Discussed the importance of holding methotrexate if she develops signs or symptoms of an infection and to resume once the infection has completely cleared.  Up-to-date with COVID-vaccine and annual flu shot.  Plan: CBC with Differential/Platelet, COMPLETE METABOLIC PANEL WITH GFR  Chronic right shoulder pain: She is experiencing some stiffness in the right shoulder but no tenderness or effusion on exam.  Primary osteoarthritis of both hands: No tenderness or synovitis noted.  Complete fist formation bilaterally.   Pain in both feet: No tenderness  or synovitis of MTP joints.  Good range of motion of both ankle joints with no tenderness or synovitis.  Effusion, left knee: No warmth or effusion noted on examination today.  She experiences some stiffness first thing in the morning and both knees and after sitting for prolonged periods of time.  She will notify us if she develops any new or worsening symptoms.  Effusion, right knee: No warmth or effusion noted on examination today.  Chronic pain of both knees: She continues to experience some stiffness in both knees but has not had any recent flares.  No effusion noted on examination today.  She will remain on methotrexate as monotherapy for now.  She was advised to notify us if she develops any new or worsening symptoms.  Orders: Orders Placed This Encounter  Procedures   CBC with Differential/Platelet   COMPLETE METABOLIC PANEL WITH GFR   Meds ordered this encounter  Medications   folic acid (FOLVITE) 1 MG tablet    Sig: Take 2 tablets (2 mg total) by mouth daily.    Dispense:  180 tablet    Refill:  3      Follow-Up Instructions: Return in 5 months (on 02/27/2024) for Rheumatoid arthritis, Osteoarthritis.   Gearldine Bienenstock, PA-C  Note - This record has been created using Dragon software.  Chart creation errors have been sought, but may not always  have been located. Such creation errors do not reflect on  the standard of medical care.

## 2023-09-23 DIAGNOSIS — E785 Hyperlipidemia, unspecified: Secondary | ICD-10-CM | POA: Diagnosis not present

## 2023-09-25 DIAGNOSIS — Z0000 Encounter for general adult medical examination without abnormal findings: Secondary | ICD-10-CM | POA: Diagnosis not present

## 2023-09-25 DIAGNOSIS — F411 Generalized anxiety disorder: Secondary | ICD-10-CM | POA: Diagnosis not present

## 2023-09-25 DIAGNOSIS — E785 Hyperlipidemia, unspecified: Secondary | ICD-10-CM | POA: Diagnosis not present

## 2023-09-29 ENCOUNTER — Encounter: Payer: Self-pay | Admitting: Physician Assistant

## 2023-09-29 ENCOUNTER — Ambulatory Visit: Payer: BC Managed Care – PPO | Attending: Physician Assistant | Admitting: Physician Assistant

## 2023-09-29 VITALS — BP 105/70 | HR 79 | Resp 14 | Ht 66.0 in | Wt 181.0 lb

## 2023-09-29 DIAGNOSIS — M0600 Rheumatoid arthritis without rheumatoid factor, unspecified site: Secondary | ICD-10-CM | POA: Diagnosis not present

## 2023-09-29 DIAGNOSIS — M25511 Pain in right shoulder: Secondary | ICD-10-CM | POA: Diagnosis not present

## 2023-09-29 DIAGNOSIS — G8929 Other chronic pain: Secondary | ICD-10-CM

## 2023-09-29 DIAGNOSIS — M19042 Primary osteoarthritis, left hand: Secondary | ICD-10-CM

## 2023-09-29 DIAGNOSIS — Z79899 Other long term (current) drug therapy: Secondary | ICD-10-CM | POA: Diagnosis not present

## 2023-09-29 DIAGNOSIS — M19041 Primary osteoarthritis, right hand: Secondary | ICD-10-CM

## 2023-09-29 DIAGNOSIS — M25561 Pain in right knee: Secondary | ICD-10-CM

## 2023-09-29 DIAGNOSIS — M79671 Pain in right foot: Secondary | ICD-10-CM

## 2023-09-29 DIAGNOSIS — M79672 Pain in left foot: Secondary | ICD-10-CM

## 2023-09-29 DIAGNOSIS — M25461 Effusion, right knee: Secondary | ICD-10-CM

## 2023-09-29 DIAGNOSIS — M25562 Pain in left knee: Secondary | ICD-10-CM

## 2023-09-29 DIAGNOSIS — M25462 Effusion, left knee: Secondary | ICD-10-CM

## 2023-09-29 MED ORDER — FOLIC ACID 1 MG PO TABS: 2.0000 mg | ORAL_TABLET | Freq: Every day | ORAL | 3 refills | Status: DC

## 2023-09-29 NOTE — Patient Instructions (Signed)

## 2023-09-29 NOTE — Progress Notes (Signed)
CBC WNL

## 2023-09-30 LAB — CBC WITH DIFFERENTIAL/PLATELET
Absolute Monocytes: 279 {cells}/uL (ref 200–950)
Basophils Absolute: 49 {cells}/uL (ref 0–200)
Basophils Relative: 1 %
Eosinophils Absolute: 108 {cells}/uL (ref 15–500)
Eosinophils Relative: 2.2 %
HCT: 40.7 % (ref 35.0–45.0)
Hemoglobin: 13 g/dL (ref 11.7–15.5)
Lymphs Abs: 1519 {cells}/uL (ref 850–3900)
MCH: 30.5 pg (ref 27.0–33.0)
MCHC: 31.9 g/dL — ABNORMAL LOW (ref 32.0–36.0)
MCV: 95.5 fL (ref 80.0–100.0)
MPV: 9.9 fL (ref 7.5–12.5)
Monocytes Relative: 5.7 %
Neutro Abs: 2945 {cells}/uL (ref 1500–7800)
Neutrophils Relative %: 60.1 %
Platelets: 383 10*3/uL (ref 140–400)
RBC: 4.26 10*6/uL (ref 3.80–5.10)
RDW: 13.7 % (ref 11.0–15.0)
Total Lymphocyte: 31 %
WBC: 4.9 10*3/uL (ref 3.8–10.8)

## 2023-09-30 LAB — COMPLETE METABOLIC PANEL WITH GFR
AG Ratio: 1.7 (calc) (ref 1.0–2.5)
ALT: 13 U/L (ref 6–29)
AST: 18 U/L (ref 10–30)
Albumin: 4.5 g/dL (ref 3.6–5.1)
Alkaline phosphatase (APISO): 90 U/L (ref 31–125)
BUN: 15 mg/dL (ref 7–25)
CO2: 25 mmol/L (ref 20–32)
Calcium: 9.4 mg/dL (ref 8.6–10.2)
Chloride: 102 mmol/L (ref 98–110)
Creat: 0.8 mg/dL (ref 0.50–0.99)
Globulin: 2.7 g/dL (ref 1.9–3.7)
Glucose, Bld: 90 mg/dL (ref 65–99)
Potassium: 4.7 mmol/L (ref 3.5–5.3)
Sodium: 136 mmol/L (ref 135–146)
Total Bilirubin: 0.4 mg/dL (ref 0.2–1.2)
Total Protein: 7.2 g/dL (ref 6.1–8.1)
eGFR: 94 mL/min/{1.73_m2} (ref >=60)

## 2023-09-30 NOTE — Progress Notes (Signed)
CMP WNL

## 2023-12-10 ENCOUNTER — Other Ambulatory Visit: Payer: Self-pay | Admitting: Physician Assistant

## 2023-12-11 NOTE — Telephone Encounter (Signed)
Last Fill: 08/11/2023  Labs: 09/29/2023 CBC WNL CMP WNL   Next Visit: 02/23/2024  Last Visit: 09/29/2023  DX: Seronegative rheumatoid arthritis   Current Dose per office note 09/29/2023: Methotrexate 0.8 mg sq injections once weekly   Okay to refill Methotrexate?

## 2024-02-09 NOTE — Progress Notes (Signed)
 Office Visit Note  Patient: Leah Ball             Date of Birth: February 07, 1981           MRN: [id number]             PCP: Jarrett Soho, PA-C Referring: Jarrett Soho, PA-C Visit Date: 02/23/2024 Occupation: @GUAROCC @  Subjective:  Pain in both knees and hips   History of Present Illness: Leah Ball is a 43 y.o. female with history of seronegative rheumatoid arthritis and osteoarthritis. Patient remains on Methotrexate 0.8 mg sq injections once weekly and folic acid 2 mg daily.  She is tolerating methotrexate without any effects or injection site reactions.  She has not had any recent gaps in therapy.  She denies any recent or recurrent infections.  Patient states that she has had some increased discomfort and stiffness involving both hips and both knees but denies any increased joint swelling.  She has been taking Tylenol very sparingly for pain relief.  She currently rates her pain a 6 out of 10.  She does not want to make any medication changes at this time since she feels that her symptoms have been manageable.   Activities of Daily Living:  Patient reports morning stiffness for 20-30 minutes.   Patient Reports nocturnal pain.  Difficulty dressing/grooming: Denies Difficulty climbing stairs: Reports Difficulty getting out of chair: Reports Difficulty using hands for taps, buttons, cutlery, and/or writing: Reports  Review of Systems  Constitutional:  Positive for fatigue.  HENT:  Positive for mouth dryness and nose dryness. Negative for mouth sores.   Eyes:  Positive for dryness. Negative for pain.  Respiratory:  Negative for shortness of breath and difficulty breathing.   Cardiovascular:  Negative for chest pain and palpitations.  Gastrointestinal:  Negative for blood in stool, constipation and diarrhea.  Endocrine: Negative for increased urination.  Genitourinary:  Negative for involuntary urination.  Musculoskeletal:  Positive for joint pain, joint pain, joint  swelling and morning stiffness. Negative for gait problem, myalgias, muscle weakness, muscle tenderness and myalgias.  Skin:  Positive for sensitivity to sunlight. Negative for color change, rash and hair loss.  Allergic/Immunologic: Positive for susceptible to infections.  Neurological:  Negative for dizziness and headaches.  Hematological:  Negative for swollen glands.  Psychiatric/Behavioral:  Positive for depressed mood and sleep disturbance. The patient is nervous/anxious.     PMFS History:  There are no active problems to display for this patient.   Past Medical History:  Diagnosis Date   Rheumatoid arthritis (HCC)    Rosacea     Family History  Problem Relation Age of Onset   Heart attack Father    High Cholesterol Father    High Cholesterol Sister    Breast cancer Paternal Aunt 42   Breast cancer Paternal Grandmother 7   Healthy Son    Past Surgical History:  Procedure Laterality Date   dental implant  2006   WISDOM TOOTH EXTRACTION  1999   Social History   Social History Narrative   Not on file   Immunization History  Administered Date(s) Administered   PFIZER(Purple Top)SARS-COV-2 Vaccination 02/18/2020, 03/21/2020, 10/18/2020   Pfizer(Comirnaty)Fall Seasonal Vaccine 12 years and older 09/23/2023     Objective: Vital Signs: BP 109/78 (BP Location: Left Arm, Patient Position: Sitting, Cuff Size: Normal)   Pulse 82   Resp 14   Ht 5\' 6"  (1.676 m)   Wt 184 lb (83.5 kg)   BMI 29.70 kg/m  Physical Exam Vitals and nursing note reviewed.  Constitutional:      Appearance: She is well-developed.  HENT:     Head: Normocephalic and atraumatic.  Eyes:     Conjunctiva/sclera: Conjunctivae normal.  Cardiovascular:     Rate and Rhythm: Normal rate and regular rhythm.     Heart sounds: Normal heart sounds.  Pulmonary:     Effort: Pulmonary effort is normal.     Breath sounds: Normal breath sounds.  Abdominal:     General: Bowel sounds are normal.      Palpations: Abdomen is soft.  Musculoskeletal:     Cervical back: Normal range of motion.  Lymphadenopathy:     Cervical: No cervical adenopathy.  Skin:    General: Skin is warm and dry.     Capillary Refill: Capillary refill takes less than 2 seconds.  Neurological:     Mental Status: She is alert and oriented to person, place, and time.  Psychiatric:        Behavior: Behavior normal.      Musculoskeletal Exam: C-spine, thoracic spine, and lumbar spine good ROM with no discomfort.  Shoulder joints, elbow joints, wrist joints, MCPs, PIPs, and DIPs good ROM with no synovitis.  Hip joints have good ROM with no groin pain.  Knee joints have good ROM with no effusion.  Ankle joints have good ROM with no tenderness or joint swelling.    CDAI Exam: CDAI Score: -- Patient Global: --; Provider Global: -- Swollen: --; Tender: -- Joint Exam 02/23/2024   No joint exam has been documented for this visit   There is currently no information documented on the homunculus. Go to the Rheumatology activity and complete the homunculus joint exam.  Investigation: No additional findings.  Imaging: No results found.  Recent Labs: Lab Results  Component Value Date   WBC 4.9 09/29/2023   HGB 13.0 09/29/2023   PLT 383 09/29/2023   NA 136 09/29/2023   K 4.7 09/29/2023   CL 102 09/29/2023   CO2 25 09/29/2023   GLUCOSE 90 09/29/2023   BUN 15 09/29/2023   CREATININE 0.80 09/29/2023   BILITOT 0.4 09/29/2023   AST 18 09/29/2023   ALT 13 09/29/2023   PROT 7.2 09/29/2023   CALCIUM 9.4 09/29/2023   GFRAA 119 04/20/2018   QFTBGOLDPLUS NEGATIVE 07/23/2022    Speciality Comments: No specialty comments available.  Procedures:  No procedures performed Allergies: Patient has no known allergies.   Assessment / Plan:     Visit Diagnoses: Seronegative rheumatoid arthritis (HCC) - No synovitis was noted on examination.  Patient has been experiencing some increased discomfort in both knees and both  hip joints. She takes tylenol as needed for pain relief.  She remains on methotrexate 0.8 ml sq injections once weekly and folic acid 2 mg daily.  She is tolerating combination therapy without any side effects or gaps in therapy.  She will notify us if she develops signs or symptoms of a flare.   She will follow up in 5 months or sooner if needed.   Plan: Lipid panel  High risk medication use - Methotrexate 0.8 mg sq injections once weekly and folic acid 2 mg daily.  Switched from oral to injectable methotrexate in March 2024.  CBC and CMP updated on 09/29/23. Orders for CBC and CMP released today.  Her next lab work will be due in June and every 3 months.  No recent or recurrent infections. Discussed the importance of holding methotrexate if she  develops signs or symptoms of an infection and to resume once the infection has completley cleared.  - Plan: CBC with Differential/Platelet, COMPLETE METABOLIC PANEL WITH GFR, Lipid panel  Screening for lipid disorders - Future order for lipid panel placed today.  Plan: Lipid panel  Chronic right shoulder pain: She has some tenderness of the right shoulder.   Primary osteoarthritis of both hands: No tenderness or joint swelling.   Pain in both feet: She has good ROM  of both ankles with no tenderness or joint swelling.   Effusion, left knee: Some fullness  and synovial thickening of the left knee was noted in the left knee but no effusion was noted.  Effusion, right knee: No warmth or effusion.   Chronic pain of both knees: Patient continues to experience arthralgias involving both knees.  She takes Tylenol sparingly for symptomatic relief.  No effusion noted today.  She declined a cortisone injection at this time.  She is advised to notify us if her symptoms persist or worsen.    Orders: Orders Placed This Encounter  Procedures   CBC with Differential/Platelet   COMPLETE METABOLIC PANEL WITH GFR   Lipid panel   No orders of the defined types  were placed in this encounter.    Follow-Up Instructions: Return in about 5 months (around 07/25/2024) for Rheumatoid arthritis, Osteoarthritis.   Gearldine Bienenstock, PA-C  Note - This record has been created using Dragon software.  Chart creation errors have been sought, but may not always  have been located. Such creation errors do not reflect on  the standard of medical care.

## 2024-02-23 ENCOUNTER — Encounter: Payer: Self-pay | Admitting: Physician Assistant

## 2024-02-23 ENCOUNTER — Ambulatory Visit: Payer: BC Managed Care – PPO | Attending: Physician Assistant | Admitting: Physician Assistant

## 2024-02-23 VITALS — BP 109/78 | HR 82 | Resp 14 | Ht 66.0 in | Wt 184.0 lb

## 2024-02-23 DIAGNOSIS — M25461 Effusion, right knee: Secondary | ICD-10-CM

## 2024-02-23 DIAGNOSIS — M25511 Pain in right shoulder: Secondary | ICD-10-CM | POA: Diagnosis not present

## 2024-02-23 DIAGNOSIS — M19041 Primary osteoarthritis, right hand: Secondary | ICD-10-CM

## 2024-02-23 DIAGNOSIS — M19042 Primary osteoarthritis, left hand: Secondary | ICD-10-CM

## 2024-02-23 DIAGNOSIS — Z13220 Encounter for screening for lipoid disorders: Secondary | ICD-10-CM

## 2024-02-23 DIAGNOSIS — G8929 Other chronic pain: Secondary | ICD-10-CM

## 2024-02-23 DIAGNOSIS — M79672 Pain in left foot: Secondary | ICD-10-CM

## 2024-02-23 DIAGNOSIS — M79671 Pain in right foot: Secondary | ICD-10-CM

## 2024-02-23 DIAGNOSIS — M25462 Effusion, left knee: Secondary | ICD-10-CM

## 2024-02-23 DIAGNOSIS — M25562 Pain in left knee: Secondary | ICD-10-CM

## 2024-02-23 DIAGNOSIS — Z79899 Other long term (current) drug therapy: Secondary | ICD-10-CM

## 2024-02-23 DIAGNOSIS — M0600 Rheumatoid arthritis without rheumatoid factor, unspecified site: Secondary | ICD-10-CM | POA: Diagnosis not present

## 2024-02-23 DIAGNOSIS — M25561 Pain in right knee: Secondary | ICD-10-CM

## 2024-02-23 LAB — CBC WITH DIFFERENTIAL/PLATELET
Absolute Lymphocytes: 1455 {cells}/uL (ref 850–3900)
Absolute Monocytes: 309 {cells}/uL (ref 200–950)
Basophils Absolute: 49 {cells}/uL (ref 0–200)
Basophils Relative: 1 %
Eosinophils Absolute: 98 {cells}/uL (ref 15–500)
Eosinophils Relative: 2 %
HCT: 39.9 % (ref 35.0–45.0)
Hemoglobin: 12.8 g/dL (ref 11.7–15.5)
MCH: 29.2 pg (ref 27.0–33.0)
MCHC: 32.1 g/dL (ref 32.0–36.0)
MCV: 91.1 fL (ref 80.0–100.0)
MPV: 9.9 fL (ref 7.5–12.5)
Monocytes Relative: 6.3 %
Neutro Abs: 2989 {cells}/uL (ref 1500–7800)
Neutrophils Relative %: 61 %
Platelets: 424 10*3/uL — ABNORMAL HIGH (ref 140–400)
RBC: 4.38 10*6/uL (ref 3.80–5.10)
RDW: 15.9 % — ABNORMAL HIGH (ref 11.0–15.0)
Total Lymphocyte: 29.7 %
WBC: 4.9 10*3/uL (ref 3.8–10.8)

## 2024-02-23 LAB — COMPLETE METABOLIC PANEL WITH GFR
AG Ratio: 1.9 (calc) (ref 1.0–2.5)
ALT: 60 U/L — ABNORMAL HIGH (ref 6–29)
AST: 51 U/L — ABNORMAL HIGH (ref 10–30)
Albumin: 4.9 g/dL (ref 3.6–5.1)
Alkaline phosphatase (APISO): 82 U/L (ref 31–125)
BUN: 13 mg/dL (ref 7–25)
CO2: 28 mmol/L (ref 20–32)
Calcium: 9.8 mg/dL (ref 8.6–10.2)
Chloride: 103 mmol/L (ref 98–110)
Creat: 0.71 mg/dL (ref 0.50–0.99)
Globulin: 2.6 g/dL (ref 1.9–3.7)
Glucose, Bld: 87 mg/dL (ref 65–99)
Potassium: 4.9 mmol/L (ref 3.5–5.3)
Sodium: 138 mmol/L (ref 135–146)
Total Bilirubin: 0.3 mg/dL (ref 0.2–1.2)
Total Protein: 7.5 g/dL (ref 6.1–8.1)
eGFR: 109 mL/min/{1.73_m2} (ref >=60)

## 2024-02-24 NOTE — Progress Notes (Signed)
 Platelet count is elevated-424K- AST and ALT are elevated--please clarify if she has been taking tylenol, NSAIDs, or alcohol?  If not--we will need to discuss reducing the dose of methotrexate with closer lab monitoring

## 2024-03-19 ENCOUNTER — Other Ambulatory Visit: Payer: Self-pay | Admitting: Physician Assistant

## 2024-03-19 NOTE — Telephone Encounter (Signed)
 Last Fill: 12/11/2023  Labs: 02/23/2024 Platelet count is elevated-424K- AST and ALT are elevated  Next Visit: 07/26/2024  Last Visit: 02/23/2024  DX: Seronegative rheumatoid arthritis   Current Dose per office note 02/23/2024: Methotrexate 0.8 mL sq injections once weekly   Okay to refill Methotrexate?

## 2024-06-16 ENCOUNTER — Other Ambulatory Visit: Payer: Self-pay | Admitting: Physician Assistant

## 2024-06-16 NOTE — Telephone Encounter (Signed)
 Last Fill: 03/19/2024  Labs: 02/23/2024 Platelet count is elevated-424K-  AST and ALT are elevated  LMOM for patient to update labs  Next Visit: 07/26/2024  Last Visit: 02/23/2024  DX: Seronegative rheumatoid arthritis   Current Dose per office note 02/23/2024: Methotrexate  0.8 mL sq injections once weekly   Okay to refill Methotrexate ?

## 2024-07-12 NOTE — Progress Notes (Deleted)
 Office Visit Note  Patient: Leah Ball             Date of Birth: 1981-09-29           MRN: [id number]             PCP: Katina Pfeiffer, PA-C Referring: Katina Pfeiffer, PA-C Visit Date: 07/26/2024 Occupation: @GUAROCC @  Subjective:  No chief complaint on file.   History of Present Illness: Leah Ball is a 43 y.o. female ***     Activities of Daily Living:  Patient reports morning stiffness for *** {minute/hour:19697}.   Patient {ACTIONS;DENIES/REPORTS:21021675::Denies} nocturnal pain.  Difficulty dressing/grooming: {ACTIONS;DENIES/REPORTS:21021675::Denies} Difficulty climbing stairs: {ACTIONS;DENIES/REPORTS:21021675::Denies} Difficulty getting out of chair: {ACTIONS;DENIES/REPORTS:21021675::Denies} Difficulty using hands for taps, buttons, cutlery, and/or writing: {ACTIONS;DENIES/REPORTS:21021675::Denies}  No Rheumatology ROS completed.   PMFS History:  There are no active problems to display for this patient.   Past Medical History:  Diagnosis Date   Rheumatoid arthritis (HCC)    Rosacea     Family History  Problem Relation Age of Onset   Heart attack Father    High Cholesterol Father    High Cholesterol Sister    Breast cancer Paternal Aunt 71   Breast cancer Paternal Grandmother 20   Healthy Son    Past Surgical History:  Procedure Laterality Date   dental implant  2006   WISDOM TOOTH EXTRACTION  1999   Social History   Social History Narrative   Not on file   Immunization History  Administered Date(s) Administered   PFIZER(Purple Top)SARS-COV-2 Vaccination 02/18/2020, 03/21/2020, 10/18/2020   Pfizer(Comirnaty)Fall Seasonal Vaccine 12 years and older 09/23/2023     Objective: Vital Signs: There were no vitals taken for this visit.   Physical Exam   Musculoskeletal Exam: ***  CDAI Exam: CDAI Score: -- Patient Global: --; Provider Global: -- Swollen: --; Tender: -- Joint Exam 07/26/2024   No joint exam has been  documented for this visit   There is currently no information documented on the homunculus. Go to the Rheumatology activity and complete the homunculus joint exam.  Investigation: No additional findings.  Imaging: No results found.  Recent Labs: Lab Results  Component Value Date   WBC 4.9 02/23/2024   HGB 12.8 02/23/2024   PLT 424 (H) 02/23/2024   NA 138 02/23/2024   K 4.9 02/23/2024   CL 103 02/23/2024   CO2 28 02/23/2024   GLUCOSE 87 02/23/2024   BUN 13 02/23/2024   CREATININE 0.71 02/23/2024   BILITOT 0.3 02/23/2024   AST 51 (H) 02/23/2024   ALT 60 (H) 02/23/2024   PROT 7.5 02/23/2024   CALCIUM 9.8 02/23/2024   GFRAA 119 04/20/2018   QFTBGOLDPLUS NEGATIVE 07/23/2022    Speciality Comments: No specialty comments available.  Procedures:  No procedures performed Allergies: Patient has no known allergies.   Assessment / Plan:     Visit Diagnoses: Seronegative rheumatoid arthritis (HCC)  High risk medication use  Chronic right shoulder pain  Primary osteoarthritis of both hands  Effusion, left knee  Effusion, right knee  Chronic pain of both knees  Pain in both feet  Orders: No orders of the defined types were placed in this encounter.  No orders of the defined types were placed in this encounter.   Face-to-face time spent with patient was *** minutes. Greater than 50% of time was spent in counseling and coordination of care.  Follow-Up Instructions: No follow-ups on file.   Waddell CHRISTELLA Craze, PA-C  Note - This record  has been created using [employer].  Chart creation errors have been sought, but may not always  have been located. Such creation errors do not reflect on  the standard of medical care.

## 2024-07-26 ENCOUNTER — Ambulatory Visit: Admitting: Physician Assistant

## 2024-07-26 DIAGNOSIS — M79672 Pain in left foot: Secondary | ICD-10-CM

## 2024-07-26 DIAGNOSIS — G8929 Other chronic pain: Secondary | ICD-10-CM

## 2024-07-26 DIAGNOSIS — M25461 Effusion, right knee: Secondary | ICD-10-CM

## 2024-07-26 DIAGNOSIS — M0600 Rheumatoid arthritis without rheumatoid factor, unspecified site: Secondary | ICD-10-CM

## 2024-07-26 DIAGNOSIS — M19041 Primary osteoarthritis, right hand: Secondary | ICD-10-CM

## 2024-07-26 DIAGNOSIS — Z79899 Other long term (current) drug therapy: Secondary | ICD-10-CM

## 2024-07-26 DIAGNOSIS — M25462 Effusion, left knee: Secondary | ICD-10-CM

## 2024-09-08 DIAGNOSIS — Z23 Encounter for immunization: Secondary | ICD-10-CM | POA: Diagnosis not present

## 2024-09-24 DIAGNOSIS — E785 Hyperlipidemia, unspecified: Secondary | ICD-10-CM | POA: Diagnosis not present

## 2024-09-28 DIAGNOSIS — Z0000 Encounter for general adult medical examination without abnormal findings: Secondary | ICD-10-CM | POA: Diagnosis not present

## 2024-09-28 DIAGNOSIS — E785 Hyperlipidemia, unspecified: Secondary | ICD-10-CM | POA: Diagnosis not present

## 2024-09-28 DIAGNOSIS — Z8249 Family history of ischemic heart disease and other diseases of the circulatory system: Secondary | ICD-10-CM | POA: Diagnosis not present

## 2024-10-05 ENCOUNTER — Other Ambulatory Visit: Payer: Self-pay | Admitting: *Deleted

## 2024-10-05 MED ORDER — FOLIC ACID 1 MG PO TABS: 2.0000 mg | ORAL_TABLET | Freq: Every day | ORAL | 3 refills | Status: AC

## 2024-10-05 NOTE — Telephone Encounter (Signed)
 Patient contacted the office requesting a refill on Folic Acid .  Last Fill: 09/29/2023  Next Visit: 10/18/2024  Last Visit: 02/23/2024  Dx: Seronegative rheumatoid arthritis   Current Dose per office note on 02/23/2024: folic acid  2 mg daily   Okay to refill Folic Acid ?

## 2024-10-11 NOTE — Progress Notes (Unsigned)
 Office Visit Note  Patient: Leah Ball             Date of Birth: 07/03/1981           MRN: [id number]             PCP: Katina Pfeiffer, PA-C Referring: Katina Pfeiffer, PA-C Visit Date: 10/18/2024 Occupation: [facility] [employer]  Subjective:    History of Present Illness: Leah Ball is a 43 y.o. female with history of seronegative rheumatoid arthritis.  Patient remains on Methotrexate  0.8 mg sq injections once weekly and folic acid  2 mg daily.    CBC and CMP updated on 02/23/24. Orders for CBC and CMP released today.  Discussed the importance of holding methotrexate  if she develops signs or sympotms of an infection and to resume once the infection has completely cleared.   Activities of Daily Living:  Patient reports morning stiffness for *** {minute/hour:19697}.   Patient {ACTIONS;DENIES/REPORTS:21021675::Denies} nocturnal pain.  Difficulty dressing/grooming: {ACTIONS;DENIES/REPORTS:21021675::Denies} Difficulty climbing stairs: {ACTIONS;DENIES/REPORTS:21021675::Denies} Difficulty getting out of chair: {ACTIONS;DENIES/REPORTS:21021675::Denies} Difficulty using hands for taps, buttons, cutlery, and/or writing: {ACTIONS;DENIES/REPORTS:21021675::Denies}  No Rheumatology ROS completed.   PMFS History:  There are no active problems to display for this patient.   Past Medical History:  Diagnosis Date   Rheumatoid arthritis (HCC)    Rosacea     Family History  Problem Relation Age of Onset   Heart attack Father    High Cholesterol Father    High Cholesterol Sister    Breast cancer Paternal Aunt 83   Breast cancer Paternal Grandmother 97   Healthy Son    Past Surgical History:  Procedure Laterality Date   dental implant  2006   WISDOM TOOTH EXTRACTION  1999   Social History   Tobacco Use   Smoking status: Never    Passive exposure: Never   Smokeless tobacco: Never  Vaping Use   Vaping status: Never Used  Substance Use Topics   Alcohol  use: Yes    Comment: occ   Drug use: Never   Social History   Social History Narrative   Not on file     Immunization History  Administered Date(s) Administered   PFIZER(Purple Top)SARS-COV-2 Vaccination 02/18/2020, 03/21/2020, 10/18/2020   Pfizer(Comirnaty)Fall Seasonal Vaccine 12 years and older 09/23/2023     Objective: Vital Signs: There were no vitals taken for this visit.   Physical Exam Vitals and nursing note reviewed.  Constitutional:      Appearance: She is well-developed.  HENT:     Head: Normocephalic and atraumatic.  Eyes:     Conjunctiva/sclera: Conjunctivae normal.  Cardiovascular:     Rate and Rhythm: Normal rate and regular rhythm.     Heart sounds: Normal heart sounds.  Pulmonary:     Effort: Pulmonary effort is normal.     Breath sounds: Normal breath sounds.  Abdominal:     General: Bowel sounds are normal.     Palpations: Abdomen is soft.  Musculoskeletal:     Cervical back: Normal range of motion.  Lymphadenopathy:     Cervical: No cervical adenopathy.  Skin:    General: Skin is warm and dry.     Capillary Refill: Capillary refill takes less than 2 seconds.  Neurological:     Mental Status: She is alert and oriented to person, place, and time.  Psychiatric:        Behavior: Behavior normal.      Musculoskeletal Exam: ***  CDAI Exam: CDAI Score: -- Patient Global: --;  Provider Global: -- Swollen: --; Tender: -- Joint Exam 10/18/2024   No joint exam has been documented for this visit   There is currently no information documented on the homunculus. Go to the Rheumatology activity and complete the homunculus joint exam.  Investigation: No additional findings.  Imaging: No results found.  Recent Labs: Lab Results  Component Value Date   WBC 4.9 02/23/2024   HGB 12.8 02/23/2024   PLT 424 (H) 02/23/2024   NA 138 02/23/2024   K 4.9 02/23/2024   CL 103 02/23/2024   CO2 28 02/23/2024   GLUCOSE 87 02/23/2024   BUN 13  02/23/2024   CREATININE 0.71 02/23/2024   BILITOT 0.3 02/23/2024   AST 51 (H) 02/23/2024   ALT 60 (H) 02/23/2024   PROT 7.5 02/23/2024   CALCIUM 9.8 02/23/2024   GFRAA 119 04/20/2018   QFTBGOLDPLUS NEGATIVE 07/23/2022    Speciality Comments: No specialty comments available.  Procedures:  No procedures performed Allergies: Patient has no known allergies.   Assessment / Plan:     Visit Diagnoses: Seronegative rheumatoid arthritis (HCC)  High risk medication use  Chronic right shoulder pain  Primary osteoarthritis of both hands  Pain in both feet  Effusion, left knee  Effusion, right knee  Chronic pain of both knees  Orders: No orders of the defined types were placed in this encounter.  No orders of the defined types were placed in this encounter.   Face-to-face time spent with patient was *** minutes. Greater than 50% of time was spent in counseling and coordination of care.  Follow-Up Instructions: No follow-ups on file.   Waddell CHRISTELLA Craze, PA-C  Note - This record has been created using Dragon software.  Chart creation errors have been sought, but may not always  have been located. Such creation errors do not reflect on  the standard of medical care.

## 2024-10-18 ENCOUNTER — Encounter: Payer: Self-pay | Admitting: Physician Assistant

## 2024-10-18 ENCOUNTER — Ambulatory Visit: Attending: Physician Assistant | Admitting: Physician Assistant

## 2024-10-18 VITALS — BP 101/65 | HR 89 | Temp 98.1°F | Resp 14 | Ht 66.0 in | Wt 182.0 lb

## 2024-10-18 DIAGNOSIS — M79672 Pain in left foot: Secondary | ICD-10-CM

## 2024-10-18 DIAGNOSIS — G8929 Other chronic pain: Secondary | ICD-10-CM

## 2024-10-18 DIAGNOSIS — M25461 Effusion, right knee: Secondary | ICD-10-CM

## 2024-10-18 DIAGNOSIS — M25511 Pain in right shoulder: Secondary | ICD-10-CM | POA: Diagnosis not present

## 2024-10-18 DIAGNOSIS — M0600 Rheumatoid arthritis without rheumatoid factor, unspecified site: Secondary | ICD-10-CM

## 2024-10-18 DIAGNOSIS — M19041 Primary osteoarthritis, right hand: Secondary | ICD-10-CM

## 2024-10-18 DIAGNOSIS — M19042 Primary osteoarthritis, left hand: Secondary | ICD-10-CM

## 2024-10-18 DIAGNOSIS — M25462 Effusion, left knee: Secondary | ICD-10-CM

## 2024-10-18 DIAGNOSIS — Z79899 Other long term (current) drug therapy: Secondary | ICD-10-CM | POA: Diagnosis not present

## 2024-10-18 DIAGNOSIS — M25562 Pain in left knee: Secondary | ICD-10-CM

## 2024-10-18 DIAGNOSIS — M79671 Pain in right foot: Secondary | ICD-10-CM

## 2024-10-18 DIAGNOSIS — M25561 Pain in right knee: Secondary | ICD-10-CM

## 2024-10-18 LAB — COMPREHENSIVE METABOLIC PANEL WITH GFR
AG Ratio: 1.7 (calc) (ref 1.0–2.5)
ALT: 12 U/L (ref 6–29)
AST: 21 U/L (ref 10–30)
Albumin: 4.6 g/dL (ref 3.6–5.1)
Alkaline phosphatase (APISO): 80 U/L (ref 31–125)
BUN: 11 mg/dL (ref 7–25)
CO2: 27 mmol/L (ref 20–32)
Calcium: 9.6 mg/dL (ref 8.6–10.2)
Chloride: 105 mmol/L (ref 98–110)
Creat: 0.75 mg/dL (ref 0.50–0.99)
Globulin: 2.7 g/dL (ref 1.9–3.7)
Glucose, Bld: 73 mg/dL (ref 65–99)
Potassium: 4.7 mmol/L (ref 3.5–5.3)
Sodium: 140 mmol/L (ref 135–146)
Total Bilirubin: 0.3 mg/dL (ref 0.2–1.2)
Total Protein: 7.3 g/dL (ref 6.1–8.1)
eGFR: 101 mL/min/1.73m2 (ref >=60)

## 2024-10-18 LAB — CBC WITH DIFFERENTIAL/PLATELET
Absolute Lymphocytes: 1261 {cells}/uL (ref 850–3900)
Absolute Monocytes: 270 {cells}/uL (ref 200–950)
Basophils Absolute: 42 {cells}/uL (ref 0–200)
Basophils Relative: 0.8 %
Eosinophils Absolute: 111 {cells}/uL (ref 15–500)
Eosinophils Relative: 2.1 %
HCT: 37.2 % (ref 35.0–45.0)
Hemoglobin: 11.8 g/dL (ref 11.7–15.5)
MCH: 30 pg (ref 27.0–33.0)
MCHC: 31.7 g/dL — ABNORMAL LOW (ref 32.0–36.0)
MCV: 94.7 fL (ref 80.0–100.0)
MPV: 9.6 fL (ref 7.5–12.5)
Monocytes Relative: 5.1 %
Neutro Abs: 3615 {cells}/uL (ref 1500–7800)
Neutrophils Relative %: 68.2 %
Platelets: 421 Thousand/uL — ABNORMAL HIGH (ref 140–400)
RBC: 3.93 Million/uL (ref 3.80–5.10)
RDW: 13.1 % (ref 11.0–15.0)
Total Lymphocyte: 23.8 %
WBC: 5.3 Thousand/uL (ref 3.8–10.8)

## 2024-10-18 NOTE — Patient Instructions (Signed)
 Standing Labs We placed an order today for your standing lab work.   Please have your standing labs drawn in February and every 3 months   Please have your labs drawn 2 weeks prior to your appointment so that the provider can discuss your lab results at your appointment, if possible.  Please note that you may see your imaging and lab results in MyChart before we have reviewed them. We will contact you once all results are reviewed. Please allow our office up to 72 hours to thoroughly review all of the results before contacting the office for clarification of your results.  WALK-IN LAB HOURS  Monday through Thursday from 8:00 am -12:30 pm and 1:00 pm-4:30 pm and Friday from 8:00 am-12:00 pm.  Patients with office visits requiring labs will be seen before walk-in labs.  You may encounter longer than normal wait times. Please allow additional time. Wait times may be shorter on  Monday and Thursday afternoons.  We do not book appointments for walk-in labs. We appreciate your patience and understanding with our staff.   Labs are drawn by [facility]. Please bring your co-pay at the time of your lab draw.  You may receive a bill from [facility] for your lab work.  Please note if you are on Hydroxychloroquine and and an order has been placed for a Hydroxychloroquine level,  you will need to have it drawn 4 hours or more after your last dose.  If you wish to have your labs drawn at another location, please call the office 24 hours in advance so we can fax the orders.  The office is located at [address], [place], [place] [address]   If you have any questions regarding directions or hours of operation,  please call [phone].   As a reminder, please drink plenty of water prior to coming for your lab work. Thanks!

## 2024-10-19 ENCOUNTER — Ambulatory Visit: Payer: Self-pay | Admitting: Physician Assistant

## 2024-10-19 NOTE — Progress Notes (Signed)
 CMP WNL Platelet count remains borderline elevated but stable. Rest of CBC WNL

## 2024-11-15 DIAGNOSIS — Z1231 Encounter for screening mammogram for malignant neoplasm of breast: Secondary | ICD-10-CM | POA: Diagnosis not present

## 2024-12-06 DIAGNOSIS — Z01419 Encounter for gynecological examination (general) (routine) without abnormal findings: Secondary | ICD-10-CM | POA: Diagnosis not present

## 2024-12-06 DIAGNOSIS — Z1331 Encounter for screening for depression: Secondary | ICD-10-CM | POA: Diagnosis not present

## 2024-12-06 DIAGNOSIS — Z124 Encounter for screening for malignant neoplasm of cervix: Secondary | ICD-10-CM | POA: Diagnosis not present

## 2025-03-21 ENCOUNTER — Ambulatory Visit: Admitting: Physician Assistant
